# Patient Record
Sex: Male | Born: 1965 | Race: White | Hispanic: No | Marital: Married | Smoking: Never smoker
Health system: Southern US, Community
[De-identification: ages and names within clinical notes are randomized; demographics above are authoritative.]

## PROBLEM LIST (undated history)

## (undated) DIAGNOSIS — E785 Hyperlipidemia, unspecified: Secondary | ICD-10-CM

---

## 2010-09-27 ENCOUNTER — Ambulatory Visit: Payer: Self-pay | Admitting: Internal Medicine

## 2013-10-30 ENCOUNTER — Ambulatory Visit: Payer: Self-pay | Admitting: Physician Assistant

## 2015-11-10 ENCOUNTER — Ambulatory Visit: Payer: 59 | Admitting: Physical Therapy

## 2015-11-11 ENCOUNTER — Ambulatory Visit: Payer: 59 | Attending: Surgical | Admitting: Physical Therapy

## 2015-11-11 ENCOUNTER — Encounter: Payer: Self-pay | Admitting: Physical Therapy

## 2015-11-11 DIAGNOSIS — M25512 Pain in left shoulder: Secondary | ICD-10-CM | POA: Diagnosis present

## 2015-11-11 DIAGNOSIS — M6281 Muscle weakness (generalized): Secondary | ICD-10-CM | POA: Diagnosis present

## 2015-11-11 DIAGNOSIS — M542 Cervicalgia: Secondary | ICD-10-CM | POA: Insufficient documentation

## 2015-11-11 DIAGNOSIS — M546 Pain in thoracic spine: Secondary | ICD-10-CM

## 2015-11-11 NOTE — Therapy (Signed)
[facility]ICINE [address] [place], [place], [address] Phone: [phone]   Fax:  [phone]  Physical Therapy Evaluation  Patient Details  Name: John Medina MRN: [id number] Date of Birth: 1966-04-01 Referring Provider: Jiles Haroldanielle Laliberte, [facility]  Encounter Date: 11/11/2015      PT End of Session - 11/11/15 1128    Visit Number 1   Number of Visits 13   Date for PT Re-Evaluation 12/23/15   PT Start Time 1000   PT Stop Time 1100   PT Time Calculation (min) 60 min   Activity Tolerance Patient tolerated treatment well   Behavior During Therapy [facility] for tasks assessed/performed      No past medical history on file.  No past surgical history on file.  There were no vitals filed for this visit.  Visit Diagnosis:  Thoracic spine pain - Plan: PT plan of care cert/re-cert  Pain in joint of left shoulder - Plan: PT plan of care cert/re-cert  Cervical spine pain - Plan: PT plan of care cert/re-cert  Muscle weakness - Plan: PT plan of care cert/re-cert      Subjective Assessment - 11/11/15 1032    Subjective Pt reports noticing a "knot/and stiffness" in mid back around Sep/oct 2016, pain progressed up back and into L shoulder/neck, progressing to where he was unable to elevate arm until cortisone shot, chiropractor was seen as with Dr. visit w/ cortisone injection which has helped, massage aggravates, home TENS unit seems to help.     Pertinent History Currently pt reports pain in shoulder and thoracic spine, no pain in neck.   Limitations Sitting;Lifting   How long can you sit comfortably? pain with onset of sitting, worse at home office   Patient Stated Goals return to requirement activities for [employer] including overhead lifting, pull ups, decr. pain   Currently in Pain? Yes   Pain Score 2    Pain Location Shoulder   Pain Orientation Left            [facility] PT Assessment - 11/11/15 0001    Assessment   Medical Diagnosis L shoulder RC tendinosis, L thoracic back strain   Referring Provider Jiles Haroldanielle Laliberte, PAC   Onset Date/Surgical Date --  around Oct/Sep 2016   Hand Dominance Right   Prior Therapy Recent visit to chiropractor    Prior Function   Level of Independence Independent   Vocation Full time employment   Vocation Requirements Primarily working on a computer    Leisure running, biking, hiking, spending time with the family   Posture/Postural Control   Posture Comments FHP, rounded shoulders   ROM / Strength   AROM / PROM / Strength AROM;Strength   AROM   Overall AROM Comments Cervical: R lat flexion 30 deg/L 15 deg. Ext limited and painful. Shoulder WFL   Strength   Overall Strength Comments L shoulder; ER and flexion 4/5 with pain, all others 5/5   Palpation   Spinal mobility significant hypomobility noted with CPAs grade I-IV from T1-T6, no lack of mobility above and below these levels. Reproduction of pain at T3.   Palpation comment Trigger points noted at UT, LS, infraspinatus, triceps, biceps.          Objective: CPAs grade III 3x1 min T2-4. Following this pt reported decr. Pain with elevation.  Biceps curl isometric hold 5x30 sec. With 5#. Pt reported no pain but slight "pulling" sensation in biceps tendon region in area of  pain with palpation with performance.  Cervical retractions 3x10, performed with and without "W" with scapular retraction - pt reported this improved his pain considerably.  Bent arm plank with scapular retraction, 5x10 sec. Hold. Focus of this and biceps exercise on non-painful isometric activation of painful regions. Look to progress both exercises at next session. Also noted significant triceps weakness on involved side.                 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   Baseline 5#   Time 6   Period Weeks   Status New   PT LONG TERM GOAL #3   Title Pt will report 0/10 pain in cervicothoracic spine with work activities   Baseline 2-5/10 pain   Time 6   Period Weeks   Status New               Plan - 11/11/15 1129    Clinical Impression Statement Pt is a pleasant 50 y/o male with c/o 6 month h/o thoracic, cervical, scapular and arm pain. 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 Pain gradually worsened in intensity until pt saw chiropractor who addressed cervical spine,  pt also had cortisone shot last week which improved but did not abolish pain in arm. Currently pt presents with shoulder and thoracic spine pain and tightness, pain with palpation of biceps, triceps, and rhomboid, and significant hypomobility in thoracic spine. Overall pain appears to centralize with thoracic treatment so will focus on this, however pt is also severely limited in cervical mobility and this may need to be addressed in the future.   Pt will benefit from skilled therapeutic intervention in order to improve on the following deficits Decreased range of motion;Improper body mechanics;Decreased mobility;Increased muscle spasms;Pain;Decreased strength;Hypomobility   Rehab Potential Good   Clinical Impairments Affecting Rehab Potential pro: activity level, prior exercise background. con: chronic pain, previous instance of neck pain and L sided pain.   PT Frequency 2x / week   PT Duration 6 weeks   PT  Treatment/Interventions Dry needling;Manual techniques;Therapeutic exercise   PT Next Visit Plan progress isometrics for bicep and tricep, continue wiht manual intervention   Consulted and Agree with Plan of Care Patient         Problem List There are no active problems to display for this patient.   Monifah Freehling PT DPT 11/11/2015, 11:46 AM  [facility] PHYSICAL AND SPORTS MEDICINE [address], [place], [address] Phone: [phone]   Fax:  [phone]  Name: John Medina MRN: [id number] Date of Birth: 12-07-1965

## 2015-11-16 ENCOUNTER — Ambulatory Visit: Payer: 59 | Admitting: Physical Therapy

## 2015-11-16 DIAGNOSIS — M25512 Pain in left shoulder: Secondary | ICD-10-CM

## 2015-11-16 DIAGNOSIS — M546 Pain in thoracic spine: Secondary | ICD-10-CM | POA: Diagnosis not present

## 2015-11-16 DIAGNOSIS — M6281 Muscle weakness (generalized): Secondary | ICD-10-CM

## 2015-11-16 NOTE — Therapy (Signed)
[facility] [address] [place], [place], [address] Phone: [phone]   Fax:  [phone]  Physical Therapy Treatment  Patient Details  Name: John PlaneJeffrey Redmon MRN: [id number] Date of Birth: 27-Jan-1966 Referring Provider: Jiles Haroldanielle Laliberte, [facility]  Encounter Date: 11/16/2015      PT End of Session - 11/16/15 1112    Visit Number 2   Number of Visits 13   Date for PT Re-Evaluation 12/23/15   PT Start Time 0947   PT Stop Time 1028   PT Time Calculation (min) 41 min   Activity Tolerance Patient tolerated treatment well   Behavior During Therapy [facility] for tasks assessed/performed      No past medical history on file.  No past surgical history on file.  There were no vitals filed for this visit.  Visit Diagnosis:  Thoracic spine pain  Pain in joint of left shoulder  Muscle weakness      Subjective Assessment - 11/16/15 0949    Subjective Pt reports significant improvement in pain overall in neck. Continues to have L scapular pain, triceps pain is moderate currently.   Pertinent History Currently pt reports pain in shoulder and thoracic spine, no pain in neck.   Limitations Sitting;Lifting   How long can you sit comfortably? pain with onset of sitting, worse at home office   Patient Stated Goals return to requirement activities for [employer] including overhead lifting, pull ups, decr. pain   Currently in Pain? Yes   Pain Score 1                  Objective: CPAs and UPAs grade III T1-T5. UPAs on L. Following this pt demonstrated improvement in ROM pain free.  Trigger point dry needling performed on deltoid, biceps, triceps. STM performed initially in this area to attempt to treat via this, unable to make appropriate change so needled identified manual spots. Most pain with triceps needle which produced high degree of pain. Following this pt reported incr. Pain and soreness in this region.  Used  Cross friction massage for STM and piston and twist technique for needling.  Reviewed HEP. Had pt perform biceps isometric with opposite arm pressure as pt has been avoiding this activity due to not having a weight. Also modified triceps isometric to be performed on the wall.                 PT Education - 11/16/15 1027    Education provided Yes   Education Details educated on trigger point dry needling, pt consented to treatment having been informed of risks.   Person(s) Educated Patient   Methods Explanation   Comprehension Verbalized understanding             PT Long Term Goals - 11/11/15 1139    PT LONG TERM GOAL #1   Title Pt will be able to lift 30# overhead pain free to return to ammo box lift for [employer]   Baseline pain with overhead flexion   Time 6   Period Weeks   Status New   PT LONG TERM GOAL #2   Title Pt will be able to perform 25# bicep curl to 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   Baseline 5#   Time 6   Period Weeks   Status New   PT LONG TERM GOAL #3   Title Pt will report 0/10 pain in cervicothoracic spine with work activities  Baseline 2-5/10 pain   Time 6   Period Weeks   Status New               Plan - 11/16/15 1114    Clinical Impression Statement Pt appears to have made progress with cervical spine primarily. Continues to have moderate pain in arm and scapulothoracic region. definite painful response to trigger point dry needling on triceps indicating potential to need to continue to treat this for several additional sessions. Muscle continues to be weak and would benefit from treatment to address this additionally.   Pt will benefit from skilled therapeutic intervention in order to improve on the following deficits Decreased range of motion;Improper body mechanics;Decreased mobility;Increased muscle spasms;Pain;Decreased strength;Hypomobility   Rehab Potential Good   Clinical Impairments  Affecting Rehab Potential pro: activity level, prior exercise background. con: chronic pain, previous instance of neck pain and L sided pain.   PT Frequency 2x / week   PT Duration 6 weeks   PT Treatment/Interventions Dry needling;Manual techniques;Therapeutic exercise   PT Next Visit Plan progress isometrics for bicep and tricep, continue wiht manual intervention   Consulted and Agree with Plan of Care Patient        Problem List There are no active problems to display for this patient.   Fisher,Benjamin PT DPT 11/16/2015, 11:18 AM  [facility] PHYSICAL AND SPORTS MEDICINE [address] [place], [address] Phone: [phone]   Fax:  [phone]  Name: John Medina MRN: [id number] Date of Birth: 19-Jan-1966

## 2015-11-18 ENCOUNTER — Ambulatory Visit: Payer: 59 | Admitting: Physical Therapy

## 2015-11-18 DIAGNOSIS — M6281 Muscle weakness (generalized): Secondary | ICD-10-CM

## 2015-11-18 DIAGNOSIS — M542 Cervicalgia: Secondary | ICD-10-CM

## 2015-11-18 DIAGNOSIS — M546 Pain in thoracic spine: Secondary | ICD-10-CM | POA: Diagnosis not present

## 2015-11-18 NOTE — Therapy (Signed)
[place] [facility] PH[facility]ORTS MEDICINE [address] [place], [address] Phone: [phone]   Fax:  [phone]  Physical Therapy Treatment  Patient Details  Name: John Medina MRN: [id number] Date of Birth: 07-28-1966 Referring Provider: Jiles Harold, [facility]  Encounter Date: 11/18/2015      PT End of Session - 11/18/15 1133    Visit Number 3   Number of Visits 13   Date for PT Re-Evaluation 12/23/15   PT Start Time 0945   PT Stop Time 1025   PT Time Calculation (min) 40 min   Activity Tolerance Patient tolerated treatment well   Behavior During Therapy [facility] for tasks assessed/performed      No past medical history on file.  No past surgical history on file.  There were no vitals filed for this visit.  Visit Diagnosis:  Muscle weakness  Thoracic spine pain  Cervical spine pain      Subjective Assessment - 11/18/15 1131    Subjective Pt reports he is doing better, triceps is significantly better. continues to have UT and some deltoid pain   Pertinent History Currently pt reports pain in shoulder and thoracic spine, no pain in neck.   Limitations Sitting;Lifting   How long can you sit comfortably? pain with onset of sitting, worse at home office   Patient Stated Goals return to requirement activities for [employer] including overhead lifting, pull ups, decr. pain   Currently in Pain? No/denies   Pain Score 0-No pain               Objective: OMEGA 75# biceps pulldown 3x15. Pt reported pain at 12th rep on final set so encouraged pt to perform at home with only 50# until capable of performing fully 50 reps pain free.  Overhead AROM/reaching performed with cuing for scapular positioning.  Shoulder press OMEGA 10# 3x5. Following this pt reported AC joint pain. Performed 2 min AC joint mobs folllowing this and then one additional set of OMEGA shoulder press with decr. Pain.  Lat stretch with pink  ball leaning forward and pulling flexing at elbows, pt had difficulty tolerating this.  Doorway QL stretch. "That is exactly where I need to stretch". Performed multiple times for 30 sec. Sets.  Trigger point dry needling performed on L UT in two locations with notable significant twitch responses. Following this performed manual neck stretches to address decr. ROM which improved pain considerably, 5 min.                  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   Baseline 5#   Time 6   Period Weeks   Status New   PT LONG TERM GOAL #3   Title Pt will report 0/10 pain in  cervicothoracic spine with work activities   Baseline 2-5/10 pain   Time 6   Period Weeks   Status New               Plan - 11/18/15 1134    Clinical Impression Statement Mild biceps pain with multiple reps of biceps lat pulldown indicating continued mild myofascial pain in this region.UT with notable trigger points. Pt is I withHEP once he is educated on performance so issued new HEP to address biceps and triceps strengthening, as well as modification of push up position.   Pt will benefit from skilled therapeutic intervention in order to improve on the following deficits Decreased range of motion;Improper body mechanics;Decreased mobility;Increased muscle spasms;Pain;Decreased strength;Hypomobility   Rehab Potential Good   Clinical Impairments Affecting Rehab  Potential pro: activity level, prior exercise background. con: chronic pain, previous instance of neck pain and L sided pain.   PT Frequency 2x / week   PT Duration 6 weeks   PT Treatment/Interventions Dry needling;Manual techniques;Therapeutic exercise   PT Next Visit Plan progress isometrics for bicep and tricep, continue wiht manual intervention   Consulted and Agree with Plan of Care Patient        Problem List There are no active problems to display for this patient.   Kenyetta Wimbish PT DPT 11/18/2015, 11:36 AM  [facility] [facility] PH[facility]TS MEDICINE [address] [place], [place], [address] Phone: [phone]   Fax:  [phone]  Name: John Medina MRN: [id number] Date of Birth: 07-17-1966

## 2015-11-23 ENCOUNTER — Ambulatory Visit: Payer: 59 | Admitting: Physical Therapy

## 2015-11-25 ENCOUNTER — Ambulatory Visit: Payer: 59 | Admitting: Physical Therapy

## 2015-11-26 ENCOUNTER — Ambulatory Visit: Payer: 59 | Admitting: Physical Therapy

## 2015-11-26 DIAGNOSIS — M25512 Pain in left shoulder: Secondary | ICD-10-CM

## 2015-11-26 DIAGNOSIS — M546 Pain in thoracic spine: Secondary | ICD-10-CM | POA: Diagnosis not present

## 2015-11-26 DIAGNOSIS — M6281 Muscle weakness (generalized): Secondary | ICD-10-CM

## 2015-11-26 NOTE — Therapy (Signed)
[place] [facility]IC[facility] [address] [place], [place], [address] Phone: [phone]   Fax:  [phone]  Physical Therapy Treatment  Patient Details  Name: John PlaneJeffrey Medina MRN: [id number] Date of Birth: Jun 20, 1966 Referring Provider: Jiles Haroldanielle Laliberte, [facility]  Encounter Date: 11/26/2015      PT End of Session - 11/26/15 1231    Visit Number 4   Number of Visits 13   Date for PT Re-Evaluation 12/23/15   PT Start Time 1156   PT Stop Time 1234   PT Time Calculation (min) 38 min   Activity Tolerance Patient tolerated treatment well   Behavior During Therapy [facility] for tasks assessed/performed      No past medical history on file.  No past surgical history on file.  There were no vitals filed for this visit.  Visit Diagnosis:  Muscle weakness  Pain in joint of left shoulder      Subjective Assessment - 11/26/15 1158    Subjective Pt reports he is doing very poorly. He is working from home currently due to a very stressful week and an inability to return for PT this week.   Pertinent History Currently pt reports pain in shoulder and thoracic spine, no pain in neck.   Limitations Sitting;Lifting   How long can you sit comfortably? pain with onset of sitting, worse at home office   Patient Stated Goals return to requirement activities for [employer] including overhead lifting, pull ups, decr. pain   Currently in Pain? Yes   Pain Score 2    Pain Location Arm   Pain Orientation Left             Objective: Based on pt report of pain focused on treating pain rather than strengthening this session.  Trigger point dry needling performed, 8 needles used over deltoid insertion, mid deltoid, down triceps. Piston technique. Notable and painful LTR in deltoid which lasted for several rounds of pistoning.  STM also performed in same region.  Following this pt reported overall pain of 1/10.  Tested resisted ER  which is now sharply painful.  Extensively educated pt on avoiding painful motions related to abduction and ER.  Isometric ER against wall, 5% engagement which pt reported produced "pull" but no pain.                    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   Baseline 5#   Time 6   Period Weeks   Status New   PT LONG TERM GOAL #3   Title Pt will report 0/10 pain in cervicothoracic spine with work activities   Baseline 2-5/10 pain   Time 6   Period Weeks   Status New  Plan - 11/26/15 1232    Clinical Impression Statement At end of session pt reported minimal pain in deltoid and triceps region compared with moderate at start of session. Focal pain in supraspinatus region so focused on isometric exercises and manual therapy to address supraspinatus tendinopathy.    Pt will benefit from skilled therapeutic intervention in order to improve on the following deficits Decreased range of motion;Improper body mechanics;Decreased mobility;Increased muscle spasms;Pain;Decreased strength;Hypomobility   Rehab Potential Good   Clinical Impairments Affecting Rehab Potential pro: activity level, prior exercise background. con: chronic pain, previous instance of neck pain and L sided pain.   PT Frequency 2x / week   PT Duration 6 weeks   PT Treatment/Interventions Dry needling;Manual techniques;Therapeutic exercise    PT Next Visit Plan progress isometrics for bicep and tricep, continue wiht manual intervention   Consulted and Agree with Plan of Care Patient        Problem List There are no active problems to display for this patient.   Darshana Curnutt PT DPT 11/26/2015, 12:35 PM  [place] [facility] PHYSICAL AND SPORTS MEDICINE [address] [place], [address] Phone: [phone]   Fax:  [phone]  Name: John Medina MRN: [id number] Date of Birth: 30-Aug-1966

## 2015-11-30 ENCOUNTER — Ambulatory Visit: Payer: 59

## 2015-11-30 ENCOUNTER — Ambulatory Visit: Payer: 59 | Admitting: Physical Therapy

## 2015-11-30 DIAGNOSIS — M6281 Muscle weakness (generalized): Secondary | ICD-10-CM

## 2015-11-30 DIAGNOSIS — M25512 Pain in left shoulder: Secondary | ICD-10-CM

## 2015-11-30 DIAGNOSIS — M546 Pain in thoracic spine: Secondary | ICD-10-CM | POA: Diagnosis not present

## 2015-12-01 NOTE — Therapy (Signed)
[place] [facility] [address], [place], [address] Phone: [phone]   Fax:  [phone]  Physical Therapy Treatment  Patient Details  Name: John Medina MRN: [id number] Date of Birth: [birth year] Referring Provider: Jiles Harold, [facility]  Encounter Date: 11/30/2015      PT End of Session - 12/01/15 6962    Visit Number 5   Number of Visits 13   Date for PT Re-Evaluation 12/23/15   PT Start Time 1530   PT Stop Time 1615   PT Time Calculation (min) 45 min   Activity Tolerance Patient tolerated treatment well   Behavior During Therapy [facility] for tasks assessed/performed      History reviewed. No pertinent past medical history.  History reviewed. No pertinent past surgical history.  There were no vitals filed for this visit.  Visit Diagnosis:  Muscle weakness  Pain in joint of left shoulder      Subjective Assessment - 11/30/15 1638    Subjective Pt reports that he has experienced very slow improvement since starting therapy. He denies pain at rest currently but still reports increase in pain with L shoulder AROM abduction from shoulder height and above. No specific questions or concerns at this time.    Pertinent History Currently pt reports pain in shoulder and thoracic spine, no pain in neck.   Limitations Sitting;Lifting   How long can you sit comfortably? pain with onset of sitting, worse at home office   Patient Stated Goals return to requirement activities for [employer] including overhead lifting, pull ups, decr. pain   Currently in Pain? No/denies  At rest      Objective:  Manual Therapy PROM L shoulder for flexion and scaption; L shoulder AP (supine) and PA (prone) mobilizations 30 sec/bout x 3 bouts each, grade III-IV; T2-T7 CPA grade III-IV 1 bout/level, 30 seconds/bout; T2-T7 L UPA grade III-IV 1 bout/level, 30 seconds/bout; Pt with no change in symptoms following cervical  manual distraction; Soft tissue assessment revealing trigger points in rhomboids as well as upper trap.  Reports some mild increase in L upper trap pain with Spurlings but not numbness/tingling into LUE; Mild decrease in L shoulder pain with scapular assist;  There-ex Pt educated about foam rolling for thoracic spine. 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   Baseline 5#   Time 6   Period Weeks   Status New   PT LONG TERM GOAL #3   Title Pt will report 0/10 pain in cervicothoracic spine with work activities   Baseline 2-5/10 pain   Time 6   Period Weeks   Status New               Plan - 12/01/15 16100822    Clinical Impression Statement Pt demonstrates consirable L mid and low trap weakness compared to R side during MMT. Pt also with poor endurance, fatiguing quickly during prone exercises. 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 Pt encouraged to continue HEP and  follow-up as scheduled.    Pt will benefit from skilled therapeutic intervention in order to improve on the following deficits Decreased range of motion;Improper body mechanics;Decreased mobility;Increased muscle spasms;Pain;Decreased strength;Hypomobility   Rehab Potential Good   Clinical Impairments Affecting Rehab Potential pro: activity level, prior exercise background. con: chronic pain, previous instance of neck pain and L sided pain.   PT Frequency 2x / week   PT Duration 6 weeks   PT Treatment/Interventions Dry needling;Manual techniques;Therapeutic exercise   PT Next Visit Plan progress isometrics for bicep and tricep, continue wiht manual intervention   PT Home Exercise Plan As prescribed   Consulted and Agree with Plan of Care Patient        Problem List There are no active problems to display for this patient.  Lynnea MaizesJason D Lillieann Pavlich PT, DPT   Wynton Hufstetler 12/01/2015, 8:24 AM  [place] [facility] [address] [place], [place], [address] Phone: [phone]   Fax:  [phone]  Name: John Medina MRN: [id number] Date of Birth: [birth year]

## 2015-12-02 ENCOUNTER — Ambulatory Visit: Payer: 59 | Admitting: Physical Therapy

## 2015-12-03 ENCOUNTER — Encounter: Payer: Self-pay | Admitting: Physical Therapy

## 2015-12-03 ENCOUNTER — Ambulatory Visit: Payer: 59

## 2015-12-03 DIAGNOSIS — M546 Pain in thoracic spine: Secondary | ICD-10-CM

## 2015-12-03 DIAGNOSIS — M6281 Muscle weakness (generalized): Secondary | ICD-10-CM

## 2015-12-03 DIAGNOSIS — M25512 Pain in left shoulder: Secondary | ICD-10-CM

## 2015-12-03 NOTE — Therapy (Signed)
[place] [facility] [address], [place], [address] Phone: [phone]   Fax:  [phone]  Physical Therapy Treatment  Patient Details  Name: John Medina MRN: [id number] Date of Birth: 23-Jan-1966 Referring Provider: Jiles Harold, [facility]  Encounter Date: 12/03/2015      PT End of Session - 12/03/15 0858    Visit Number 6   Number of Visits 13   Date for PT Re-Evaluation 12/23/15   PT Start Time 0805   PT Stop Time 0845   PT Time Calculation (min) 40 min   Activity Tolerance Patient tolerated treatment well   Behavior During Therapy [facility] for tasks assessed/performed      History reviewed. No pertinent past medical history.  History reviewed. No pertinent past surgical history.  There were no vitals filed for this visit.  Visit Diagnosis:  Muscle weakness  Pain in joint of left shoulder  Thoracic spine pain      Subjective Assessment - 12/03/15 0806    Subjective Pt states he is doing well on this date. He felt well the afternoon following therapy and the next day. No pain at rest today but reports pain with overhead motions of L shoulder. No specific questions or concerns currently.    Pertinent History Currently pt reports pain in shoulder and thoracic spine, no pain in neck.   Limitations Sitting;Lifting   How long can you sit comfortably? pain with onset of sitting, worse at home office   Patient Stated Goals return to requirement activities for [employer] including overhead lifting, pull ups, decr. pain   Currently in Pain? No/denies       Objective:  Manual Therapy PROM L shoulder for flexion and scaption; L shoulder AP (supine) mobilizations 30 sec/bout x 3 bouts each, grade III-IV; Seated L shoulder abduction with upward rotation assist and mobilization at end range 2 x 10 bouts; Gentle L shoulder distraction oscillation x 1 minute; STM to L proximal biceps including long  head bicep tendon;  There-ex Standing L pec doorway stretch 30 seconds x 3; Standing L lat doorway stretch 30 seconds x 3; Pt educated about foam rolling for thoracic spine. Performed with patient moving from segment to segment first with rolling and followed by thoracic extension over foam roller; Educated about foam rolling for L lats; Prone L shoulder extension 2# x 10; Prone L shoulder mid trap rows 2#  x 10; Prone lower trap "Ys" no weight x 10; MMT reveals considerable weakness of L mid and low traps (3+/5) compared to R side (4 to 4+);                           PT Education - 12/03/15 0855    Education provided Yes   Education Details HEP reinforced, discussed external impingement, educated about postural education as well as pec and lat stretches   Person(s) Educated Patient   Methods Explanation   Comprehension Verbalized understanding             PT Long Term Goals - 11/11/15 1139    PT LONG TERM GOAL #1   Title Pt will be able to lift 30# overhead pain free 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   Baseline 5#   Time 6   Period Weeks   Status New   PT LONG TERM GOAL #3   Title Pt will report 0/10 pain in cervicothoracic spine with work activities   Baseline 2-5/10 pain   Time 6   Period Weeks   Status New               Plan - 12/03/15 0906    Clinical Impression Statement Pt reports significant pain with light palpation over L long head bicep tendon. Significant forward scapular protraction noted in sitting and supine with poor sitting posture. Discussed the importance of seated posture with scapular and cervical retraction to neutral to miimiize L shoulder pain/impingement. Pt educated about pec and lat  stretches as well as foam rolling for thoracic mobility. Pt educated about how to roll lats. May benefit from education about how to roll pec muscles as well. Pt encouraged to continue HEP with particular focus on posture. Follow-up as scheduled.    Pt will benefit from skilled therapeutic intervention in order to improve on the following deficits Decreased range of motion;Improper body mechanics;Decreased mobility;Increased muscle spasms;Pain;Decreased strength;Hypomobility   Rehab Potential Good   Clinical Impairments Affecting Rehab Potential pro: activity level, prior exercise background. con: chronic pain, previous instance of neck pain and L sided pain.   PT Frequency 2x / week   PT Duration 6 weeks   PT Treatment/Interventions Dry needling;Manual techniques;Therapeutic exercise   PT Next Visit Plan progress isometrics for bicep and tricep, continue with manual intervention, foam rolling education for pec and lats   PT Home Exercise Plan As prescribed   Consulted and Agree with Plan of Care Patient        Problem List There are no active problems to display for this patient.  Lynnea MaizesJason D Anamari Galeas PT, DPT   Cassadee Vanzandt 12/03/2015, 9:11 AM  [facility] [address] [place], [place], [address] Phone: [phone]   Fax:  [phone]  Name: John Medina MRN: [id number] Date of Birth: May 30, 1966

## 2015-12-07 ENCOUNTER — Ambulatory Visit: Payer: 59 | Attending: Surgical | Admitting: Physical Therapy

## 2015-12-07 DIAGNOSIS — M6281 Muscle weakness (generalized): Secondary | ICD-10-CM | POA: Insufficient documentation

## 2015-12-07 DIAGNOSIS — M25512 Pain in left shoulder: Secondary | ICD-10-CM | POA: Diagnosis present

## 2015-12-07 NOTE — Therapy (Signed)
[place] [facility] [address], [place], [address] Phone: [phone]   Fax:  [phone]  Physical Therapy Treatment  Patient Details  Name: John Medina MRN: [id number] Date of Birth: 02-Jan-1966 Referring Provider: Jiles Harold, [facility]  Encounter Date: 12/07/2015      PT End of Session - 12/07/15 1043    Visit Number 7   Number of Visits 13   Date for PT Re-Evaluation 12/23/15   PT Start Time 0900   PT Stop Time 0940   PT Time Calculation (min) 40 min   Activity Tolerance Patient tolerated treatment well   Behavior During Therapy [facility] for tasks assessed/performed      No past medical history on file.  No past surgical history on file.  There were no vitals filed for this visit.  Visit Diagnosis:  Muscle weakness      Subjective Assessment - 12/07/15 1005    Subjective Pt reports he is doing well. He was ill over the weekend so has been inconsistent with his HEP. He is feelign better today but acknowledges that he is not using his UE.   Pertinent History Currently pt reports pain in shoulder and thoracic spine, no pain in neck.   Limitations Sitting;Lifting   How long can you sit comfortably? pain with onset of sitting, worse at home office   Patient Stated Goals return to requirement activities for [employer] including overhead lifting, pull ups, decr. pain   Currently in Pain? No/denies   Pain Score 0-No pain            Objective: UE ranger for decr. Tightness with AROM 3x10 end range shoulder flexion stretch, arm at side ER stretch 3x10.  RTB low row for scap activation, pt able to perform but with thready activation.  RTB fast oscillations 3x30 sec. Unilaterally performed to improve activaiton, cuing to IR shoulder to improve activation.  OMEGA shoulder press 1x15 with 25#, incr. Pain 1x8 with 15# able to perform 6 prior to pain, primarily in deltoid. With cuing to  retract cervical spine improved pain but continued so deferred additional shoulder press  Forward flies, no weight 3x10. 2# wt 3x5.  Lateral flies no weight 1x5, pt reported incr. Tightness and some deltoid pain with this. Added 2# wt which improved pain, pt able to perform x3 with no pain.  Educated pt on performing 2-3 reps every 2-3 hrs to incr. Tolerance for exercise.  Bent row 3x5 with 2# wt.  Issued this as HEP for pt with cuing to avoid pain but to improve posture.                     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   Baseline 5#   Time 6   Period Weeks   Status New  PT LONG TERM GOAL #3   Title Pt will report 0/10 pain in cervicothoracic spine with work activities   Baseline 2-5/10 pain   Time 6   Period Weeks   Status New               Plan - 12/07/15 1046    Clinical Impression Statement As pt has been feeling decr. pain, focused on low level exercise while challenging posture. Pt continues to be somewhat limited in tolerance for activity but this is fairly typical for someone who has been avoding use of UE for an extended period of time. Focus of next few sessions on progressing tolerance for activity while minimizing pain.   Pt will benefit from skilled therapeutic intervention in order to improve on the following deficits Decreased range of motion;Improper body mechanics;Decreased mobility;Increased muscle spasms;Pain;Decreased strength;Hypomobility   Rehab Potential Good   Clinical  Impairments Affecting Rehab Potential pro: activity level, prior exercise background. con: chronic pain, previous instance of neck pain and L sided pain.   PT Frequency 2x / week   PT Duration 6 weeks   PT Treatment/Interventions Dry needling;Manual techniques;Therapeutic exercise   PT Next Visit Plan progress isometrics for bicep and tricep, continue with manual intervention, foam rolling education for pec and lats   PT Home Exercise Plan As prescribed   Consulted and Agree with Plan of Care Patient        Problem List There are no active problems to display for this patient.   Lizanne Erker PT DPT 12/07/2015, 10:52 AM  [place] [facility] [address] [place], [place], [address] Phone: [phone]   Fax:  [phone]  Name: John Medina MRN: [id number] Date of Birth: 26-Mar-1966

## 2015-12-10 ENCOUNTER — Ambulatory Visit: Payer: 59 | Admitting: Physical Therapy

## 2015-12-10 DIAGNOSIS — M6281 Muscle weakness (generalized): Secondary | ICD-10-CM | POA: Diagnosis not present

## 2015-12-10 DIAGNOSIS — M25512 Pain in left shoulder: Secondary | ICD-10-CM

## 2015-12-10 NOTE — Therapy (Signed)
[place] [facility] [address], [place], [address] Phone: [phone]   Fax:  [phone]  Physical Therapy Treatment  Patient Details  Name: Laurent Cargile MRN: [id number] Date of Birth: [birth year] Referring Provider: Jiles Harold, [facility]  Encounter Date: 12/10/2015      PT End of Session - 12/10/15 1511    Visit Number 8   Number of Visits 13   Date for PT Re-Evaluation 12/23/15   PT Start Time 1400   PT Stop Time 1430   PT Time Calculation (min) 30 min   Activity Tolerance Patient tolerated treatment well   Behavior During Therapy [facility] for tasks assessed/performed      No past medical history on file.  No past surgical history on file.  There were no vitals filed for this visit.  Visit Diagnosis:  Pain in joint of left shoulder  Muscle weakness      Subjective Assessment - 12/10/15 1403    Subjective Pt reports he is doing well. He was ill over the weekend so has been inconsistent with his HEP. He is feelign better today but acknowledges that he is not using his UE.   Pertinent History Currently pt reports pain in shoulder and thoracic spine, no pain in neck.   Limitations Sitting;Lifting   How long can you sit comfortably? pain with onset of sitting, worse at home office   Patient Stated Goals return to requirement activities for [employer] including overhead lifting, pull ups, decr. pain   Currently in Pain? Yes   Pain Score 3    Pain Location Arm   Pain Orientation Left              Objective: Supine arm crank mob x5 min total, extensive cuing for relaxation. Initially pt c/o pain with this in posterior cuff, performed 1 min MFR which resolved pain.  STM performed on deltoid, triceps, lat. Followign this pt reported his pain was improved but not resolved.   Trigger point dry needling performed on the same. Lat significantly painful with highly irritable trigger  point.  Following session pt reported complete reslution of pain.                        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   Baseline 5#   Time 6   Period Weeks   Status New   PT LONG TERM GOAL #3   Title Pt will report 0/10 pain in cervicothoracic spine with work activities   Baseline 2-5/10 pain   Time 6   Period Weeks   Status New               Plan - 12/10/15 1512    Clinical Impression Statement Pain has been incr. over the past few days so focus of session on pain relief. Pt responded well to manual therapy. PT encouraged pt to attempt pull up at next session as he needs to be able to do this due  to [employer] requirements.   Pt will benefit from skilled therapeutic intervention in order to improve on the following deficits Decreased range of motion;Improper body mechanics;Decreased mobility;Increased muscle spasms;Pain;Decreased strength;Hypomobility   Rehab Potential Good   Clinical Impairments Affecting Rehab Potential pro: activity level, prior exercise background. con: chronic pain, previous instance of neck pain and L sided pain.   PT Frequency 2x / week   PT Duration 6 weeks   PT Treatment/Interventions Dry needling;Manual techniques;Therapeutic exercise   PT Next Visit Plan progress isometrics for bicep and tricep, continue with manual intervention, foam rolling education for pec and lats   PT Home Exercise Plan As prescribed   Consulted and Agree with Plan of Care Patient        Problem List There are no active problems to display for this patient.   Fisher,Benjamin PT DPT 12/10/2015, 3:21  PM  [place] [facility] [address] [place], [place], [address] Phone: [phone]   Fax:  [phone]  Name: Sheliah PlaneJeffrey Manthey MRN: [id number] Date of Birth: 03-07-1966

## 2015-12-14 ENCOUNTER — Ambulatory Visit: Payer: 59 | Admitting: Physical Therapy

## 2015-12-14 DIAGNOSIS — M6281 Muscle weakness (generalized): Secondary | ICD-10-CM | POA: Diagnosis not present

## 2015-12-14 DIAGNOSIS — M25512 Pain in left shoulder: Secondary | ICD-10-CM

## 2015-12-14 NOTE — Therapy (Signed)
[place] [facility] [address] [place], [address] Phone: [phone]   Fax:  [phone]  Physical Therapy Treatment  Patient Details  Name: Drevin Ortner MRN: [id number] Date of Birth: 1966/04/01 Referring Provider: Jiles Harold, [facility]  Encounter Date: 12/14/2015      PT End of Session - 12/14/15 1437    Visit Number 9   Number of Visits 13   Date for PT Re-Evaluation 12/23/15   PT Start Time 1357   PT Stop Time 1437   PT Time Calculation (min) 40 min   Activity Tolerance Patient tolerated treatment well   Behavior During Therapy [facility] for tasks assessed/performed      No past medical history on file.  No past surgical history on file.  There were no vitals filed for this visit.      Subjective Assessment - 12/14/15 1355    Subjective Pt felt well over the weekend, he was able to use a posthole digger with no difficulty. primary pain is in L UT and pt has had inconsistent HEP   Pertinent History Currently pt reports pain in shoulder and thoracic spine, no pain in neck.   Limitations Sitting;Lifting   How long can you sit comfortably? pain with onset of sitting, worse at home office   Patient Stated Goals return to requirement activities for [employer] including overhead lifting, pull ups, decr. pain   Currently in Pain? Yes   Pain Score 2    Pain Orientation Left            Objective: STM performed on L UT, LS, scalene.  Trigger point dry needling performed on UT and LS, noted highly antalgic dry needling on L UT with immediate improvement in pain to 0/10.  Following this performed seated LS stretch seated with hand under seat and head turned to the opposite side. x5 min total with cuing for performance at home.  Farmer's carry 40# 5x10 meters, cuing for activation of LS muscle.  Overhead 8# press with rotation, hip flexion (spiderman press). Following this pt reported  significant improvement in tightness to "no tightness".                          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   Baseline 5#   Time 6   Period Weeks   Status New   PT LONG TERM GOAL #3   Title Pt will report 0/10 pain in cervicothoracic spine with work activities   Baseline 2-5/10 pain   Time 6   Period Weeks   Status New               Plan - 12/14/15 1438    Clinical Impression Statement Improvement within session with UT and LS palpation/pain with dry needling, manual intervention and exercise. Continues to be stiff in this region and would benefit from focused low trap activation to facilitate upper trap deactivation.   Rehab Potential Good   Clinical  Impairments Affecting Rehab Potential pro: activity level, prior exercise background. con: chronic pain, previous instance of neck pain and L sided pain.   PT Frequency 2x / week   PT Duration 6 weeks   PT Treatment/Interventions Dry needling;Manual techniques;Therapeutic exercise   PT Next Visit Plan progress isometrics for bicep and tricep, continue with manual intervention, foam rolling education for pec and lats   PT Home Exercise Plan As prescribed   Consulted and Agree with Plan of Care Patient      Patient will benefit from skilled therapeutic intervention in order to improve the following deficits and impairments:  Decreased range of motion, Improper body mechanics, Decreased mobility, Increased muscle spasms, Pain, Decreased strength, Hypomobility  Visit Diagnosis: Pain in joint of left shoulder  Muscle weakness     Problem List There are no  active problems to display for this patient.   Fisher,Benjamin PT DPT 12/14/2015, 2:42 PM  [place] [facility] [address] [place], [place], [address] Phone: [phone]   Fax:  [phone]  Name: Sheliah PlaneJeffrey Gillen MRN: [id number] Date of Birth: 1966/05/04

## 2015-12-17 ENCOUNTER — Ambulatory Visit: Payer: 59 | Admitting: Physical Therapy

## 2015-12-21 ENCOUNTER — Ambulatory Visit: Payer: 59 | Admitting: Physical Therapy

## 2015-12-24 ENCOUNTER — Encounter: Payer: 59 | Admitting: Physical Therapy

## 2015-12-28 ENCOUNTER — Encounter: Payer: 59 | Admitting: Physical Therapy

## 2015-12-30 ENCOUNTER — Encounter: Payer: 59 | Admitting: Physical Therapy

## 2016-01-04 ENCOUNTER — Encounter: Payer: 59 | Admitting: Physical Therapy

## 2016-01-07 ENCOUNTER — Encounter: Payer: 59 | Admitting: Physical Therapy

## 2016-01-11 ENCOUNTER — Encounter: Payer: 59 | Admitting: Physical Therapy

## 2016-01-14 ENCOUNTER — Encounter: Payer: 59 | Admitting: Physical Therapy

## 2016-10-12 DIAGNOSIS — M791 Myalgia: Secondary | ICD-10-CM | POA: Diagnosis not present

## 2016-10-12 DIAGNOSIS — M9901 Segmental and somatic dysfunction of cervical region: Secondary | ICD-10-CM | POA: Diagnosis not present

## 2016-10-12 DIAGNOSIS — M542 Cervicalgia: Secondary | ICD-10-CM | POA: Diagnosis not present

## 2016-11-17 DIAGNOSIS — M9901 Segmental and somatic dysfunction of cervical region: Secondary | ICD-10-CM | POA: Diagnosis not present

## 2016-11-17 DIAGNOSIS — M542 Cervicalgia: Secondary | ICD-10-CM | POA: Diagnosis not present

## 2016-11-17 DIAGNOSIS — M791 Myalgia: Secondary | ICD-10-CM | POA: Diagnosis not present

## 2016-12-30 DIAGNOSIS — M542 Cervicalgia: Secondary | ICD-10-CM | POA: Diagnosis not present

## 2016-12-30 DIAGNOSIS — M9901 Segmental and somatic dysfunction of cervical region: Secondary | ICD-10-CM | POA: Diagnosis not present

## 2016-12-30 DIAGNOSIS — M791 Myalgia: Secondary | ICD-10-CM | POA: Diagnosis not present

## 2017-01-20 DIAGNOSIS — M9901 Segmental and somatic dysfunction of cervical region: Secondary | ICD-10-CM | POA: Diagnosis not present

## 2017-01-20 DIAGNOSIS — M542 Cervicalgia: Secondary | ICD-10-CM | POA: Diagnosis not present

## 2017-01-20 DIAGNOSIS — M9903 Segmental and somatic dysfunction of lumbar region: Secondary | ICD-10-CM | POA: Diagnosis not present

## 2017-02-15 DIAGNOSIS — M5412 Radiculopathy, cervical region: Secondary | ICD-10-CM | POA: Diagnosis not present

## 2017-02-15 DIAGNOSIS — M25512 Pain in left shoulder: Secondary | ICD-10-CM | POA: Diagnosis not present

## 2017-03-01 DIAGNOSIS — M542 Cervicalgia: Secondary | ICD-10-CM | POA: Diagnosis not present

## 2017-03-01 DIAGNOSIS — M25512 Pain in left shoulder: Secondary | ICD-10-CM | POA: Diagnosis not present

## 2017-03-07 ENCOUNTER — Other Ambulatory Visit: Payer: Self-pay | Admitting: Orthopedic Surgery

## 2017-03-07 DIAGNOSIS — M542 Cervicalgia: Secondary | ICD-10-CM

## 2017-03-07 DIAGNOSIS — M25512 Pain in left shoulder: Secondary | ICD-10-CM

## 2017-03-14 ENCOUNTER — Ambulatory Visit: Payer: 59

## 2017-03-15 ENCOUNTER — Ambulatory Visit: Admission: RE | Admit: 2017-03-15 | Payer: 59 | Source: Ambulatory Visit

## 2017-03-15 ENCOUNTER — Ambulatory Visit: Payer: 59

## 2017-03-24 ENCOUNTER — Ambulatory Visit
Admission: RE | Admit: 2017-03-24 | Discharge: 2017-03-24 | Disposition: A | Discharge status: Home / Self Care | Payer: 59 | Source: Ambulatory Visit | Attending: Orthopedic Surgery | Admitting: Orthopedic Surgery

## 2017-03-24 DIAGNOSIS — M4802 Spinal stenosis, cervical region: Secondary | ICD-10-CM | POA: Insufficient documentation

## 2017-03-24 DIAGNOSIS — R609 Edema, unspecified: Secondary | ICD-10-CM | POA: Insufficient documentation

## 2017-03-24 DIAGNOSIS — M25512 Pain in left shoulder: Secondary | ICD-10-CM | POA: Diagnosis not present

## 2017-03-24 DIAGNOSIS — M542 Cervicalgia: Secondary | ICD-10-CM | POA: Diagnosis not present

## 2017-03-28 ENCOUNTER — Ambulatory Visit
Admission: RE | Admit: 2017-03-28 | Discharge: 2017-03-28 | Disposition: A | Discharge status: Home / Self Care | Payer: 59 | Source: Ambulatory Visit | Attending: Orthopedic Surgery | Admitting: Orthopedic Surgery

## 2017-03-28 DIAGNOSIS — X58XXXA Exposure to other specified factors, initial encounter: Secondary | ICD-10-CM | POA: Insufficient documentation

## 2017-03-28 DIAGNOSIS — M25512 Pain in left shoulder: Secondary | ICD-10-CM

## 2017-03-28 DIAGNOSIS — S43432A Superior glenoid labrum lesion of left shoulder, initial encounter: Secondary | ICD-10-CM | POA: Diagnosis not present

## 2017-03-28 MED ORDER — GADOBENATE DIMEGLUMINE 529 MG/ML IV SOLN
0.1000 mL | Freq: Once | INTRAVENOUS | Status: AC | PRN
  Administered 2017-03-28: 0.1 mL via INTRA_ARTICULAR

## 2017-03-28 MED ORDER — IOPAMIDOL (ISOVUE-200) INJECTION 41%
10.0000 mL | Freq: Once | INTRAVENOUS | Status: AC | PRN
  Administered 2017-03-28: 10 mL
  Filled 2017-03-28: qty 50

## 2017-03-28 MED ORDER — LIDOCAINE HCL (PF) 1 % IJ SOLN
30.0000 mL | Freq: Once | INTRAMUSCULAR | Status: AC
  Administered 2017-03-28: 10 mL via INTRADERMAL
  Filled 2017-03-28: qty 30

## 2017-03-30 DIAGNOSIS — M50222 Other cervical disc displacement at C5-C6 level: Secondary | ICD-10-CM | POA: Diagnosis not present

## 2017-03-30 DIAGNOSIS — M5412 Radiculopathy, cervical region: Secondary | ICD-10-CM | POA: Diagnosis not present

## 2017-03-30 DIAGNOSIS — M50223 Other cervical disc displacement at C6-C7 level: Secondary | ICD-10-CM | POA: Diagnosis not present

## 2017-04-06 DIAGNOSIS — M5412 Radiculopathy, cervical region: Secondary | ICD-10-CM | POA: Diagnosis not present

## 2017-04-06 DIAGNOSIS — M50223 Other cervical disc displacement at C6-C7 level: Secondary | ICD-10-CM | POA: Diagnosis not present

## 2017-04-06 DIAGNOSIS — M50222 Other cervical disc displacement at C5-C6 level: Secondary | ICD-10-CM | POA: Diagnosis not present

## 2017-04-24 DIAGNOSIS — M5412 Radiculopathy, cervical region: Secondary | ICD-10-CM | POA: Diagnosis not present

## 2017-06-01 DIAGNOSIS — M791 Myalgia: Secondary | ICD-10-CM | POA: Diagnosis not present

## 2017-06-01 DIAGNOSIS — M9901 Segmental and somatic dysfunction of cervical region: Secondary | ICD-10-CM | POA: Diagnosis not present

## 2017-06-01 DIAGNOSIS — M542 Cervicalgia: Secondary | ICD-10-CM | POA: Diagnosis not present

## 2017-06-06 DIAGNOSIS — M4802 Spinal stenosis, cervical region: Secondary | ICD-10-CM | POA: Diagnosis not present

## 2017-06-06 DIAGNOSIS — M5030 Other cervical disc degeneration, unspecified cervical region: Secondary | ICD-10-CM | POA: Diagnosis not present

## 2017-06-06 DIAGNOSIS — M5412 Radiculopathy, cervical region: Secondary | ICD-10-CM | POA: Diagnosis not present

## 2017-06-19 DIAGNOSIS — M5030 Other cervical disc degeneration, unspecified cervical region: Secondary | ICD-10-CM | POA: Diagnosis not present

## 2017-06-20 DIAGNOSIS — M7542 Impingement syndrome of left shoulder: Secondary | ICD-10-CM | POA: Diagnosis not present

## 2017-06-21 DIAGNOSIS — I10 Essential (primary) hypertension: Secondary | ICD-10-CM | POA: Diagnosis not present

## 2017-06-21 DIAGNOSIS — Z125 Encounter for screening for malignant neoplasm of prostate: Secondary | ICD-10-CM | POA: Diagnosis not present

## 2017-06-21 DIAGNOSIS — Z0000 Encounter for general adult medical examination without abnormal findings: Secondary | ICD-10-CM | POA: Diagnosis not present

## 2017-06-23 ENCOUNTER — Other Ambulatory Visit: Payer: Self-pay | Admitting: Orthopedic Surgery

## 2017-06-26 DIAGNOSIS — Z791 Long term (current) use of non-steroidal anti-inflammatories (NSAID): Secondary | ICD-10-CM | POA: Diagnosis not present

## 2017-06-26 DIAGNOSIS — I10 Essential (primary) hypertension: Secondary | ICD-10-CM | POA: Diagnosis not present

## 2017-06-26 DIAGNOSIS — M5030 Other cervical disc degeneration, unspecified cervical region: Secondary | ICD-10-CM | POA: Diagnosis not present

## 2017-06-30 DIAGNOSIS — M5030 Other cervical disc degeneration, unspecified cervical region: Secondary | ICD-10-CM | POA: Diagnosis not present

## 2017-07-03 DIAGNOSIS — M5030 Other cervical disc degeneration, unspecified cervical region: Secondary | ICD-10-CM | POA: Diagnosis not present

## 2017-07-04 ENCOUNTER — Inpatient Hospital Stay
Admission: RE | Admit: 2017-07-04 | Discharge: 2017-07-04 | Disposition: A | Discharge status: Home / Self Care | Payer: 59 | Source: Ambulatory Visit

## 2017-07-05 DIAGNOSIS — E785 Hyperlipidemia, unspecified: Secondary | ICD-10-CM | POA: Diagnosis not present

## 2017-07-05 DIAGNOSIS — I10 Essential (primary) hypertension: Secondary | ICD-10-CM | POA: Diagnosis not present

## 2017-07-06 DIAGNOSIS — M542 Cervicalgia: Secondary | ICD-10-CM | POA: Diagnosis not present

## 2017-07-06 DIAGNOSIS — S43432D Superior glenoid labrum lesion of left shoulder, subsequent encounter: Secondary | ICD-10-CM | POA: Diagnosis not present

## 2017-07-06 DIAGNOSIS — M9901 Segmental and somatic dysfunction of cervical region: Secondary | ICD-10-CM | POA: Diagnosis not present

## 2017-07-06 DIAGNOSIS — M7542 Impingement syndrome of left shoulder: Secondary | ICD-10-CM | POA: Diagnosis not present

## 2017-07-06 DIAGNOSIS — M7918 Myalgia, other site: Secondary | ICD-10-CM | POA: Diagnosis not present

## 2017-07-07 ENCOUNTER — Inpatient Hospital Stay: Admission: RE | Admit: 2017-07-07 | Payer: 59 | Source: Ambulatory Visit

## 2017-07-07 DIAGNOSIS — M5030 Other cervical disc degeneration, unspecified cervical region: Secondary | ICD-10-CM | POA: Diagnosis not present

## 2017-07-10 DIAGNOSIS — M5030 Other cervical disc degeneration, unspecified cervical region: Secondary | ICD-10-CM | POA: Diagnosis not present

## 2017-07-11 ENCOUNTER — Encounter: Admission: RE | Payer: Self-pay | Source: Ambulatory Visit

## 2017-07-11 ENCOUNTER — Ambulatory Visit: Admission: RE | Admit: 2017-07-11 | Payer: 59 | Source: Ambulatory Visit | Admitting: Orthopedic Surgery

## 2017-07-11 SURGERY — Surgical Case
Anesthesia: *Unknown

## 2017-07-11 SURGERY — ARTHROSCOPY, SHOULDER WITH REPAIR, ROTATOR CUFF, OPEN
Anesthesia: Choice | Laterality: Left

## 2017-07-14 DIAGNOSIS — M5030 Other cervical disc degeneration, unspecified cervical region: Secondary | ICD-10-CM | POA: Diagnosis not present

## 2017-07-17 DIAGNOSIS — M5030 Other cervical disc degeneration, unspecified cervical region: Secondary | ICD-10-CM | POA: Diagnosis not present

## 2017-07-21 DIAGNOSIS — M5030 Other cervical disc degeneration, unspecified cervical region: Secondary | ICD-10-CM | POA: Diagnosis not present

## 2017-07-26 DIAGNOSIS — M5030 Other cervical disc degeneration, unspecified cervical region: Secondary | ICD-10-CM | POA: Diagnosis not present

## 2017-07-31 DIAGNOSIS — M5030 Other cervical disc degeneration, unspecified cervical region: Secondary | ICD-10-CM | POA: Diagnosis not present

## 2017-08-04 DIAGNOSIS — M5030 Other cervical disc degeneration, unspecified cervical region: Secondary | ICD-10-CM | POA: Diagnosis not present

## 2017-08-10 DIAGNOSIS — M9901 Segmental and somatic dysfunction of cervical region: Secondary | ICD-10-CM | POA: Diagnosis not present

## 2017-08-10 DIAGNOSIS — M50222 Other cervical disc displacement at C5-C6 level: Secondary | ICD-10-CM | POA: Diagnosis not present

## 2017-08-11 DIAGNOSIS — M5030 Other cervical disc degeneration, unspecified cervical region: Secondary | ICD-10-CM | POA: Diagnosis not present

## 2017-08-16 DIAGNOSIS — M5030 Other cervical disc degeneration, unspecified cervical region: Secondary | ICD-10-CM | POA: Diagnosis not present

## 2017-08-16 DIAGNOSIS — M5412 Radiculopathy, cervical region: Secondary | ICD-10-CM | POA: Diagnosis not present

## 2017-08-16 DIAGNOSIS — M4802 Spinal stenosis, cervical region: Secondary | ICD-10-CM | POA: Diagnosis not present

## 2017-08-17 DIAGNOSIS — M7542 Impingement syndrome of left shoulder: Secondary | ICD-10-CM | POA: Diagnosis not present

## 2017-08-18 DIAGNOSIS — M5030 Other cervical disc degeneration, unspecified cervical region: Secondary | ICD-10-CM | POA: Diagnosis not present

## 2017-08-21 DIAGNOSIS — M5030 Other cervical disc degeneration, unspecified cervical region: Secondary | ICD-10-CM | POA: Diagnosis not present

## 2017-08-30 DIAGNOSIS — M5030 Other cervical disc degeneration, unspecified cervical region: Secondary | ICD-10-CM | POA: Diagnosis not present

## 2017-09-20 DIAGNOSIS — Z1211 Encounter for screening for malignant neoplasm of colon: Secondary | ICD-10-CM | POA: Diagnosis not present

## 2017-09-28 DIAGNOSIS — E785 Hyperlipidemia, unspecified: Secondary | ICD-10-CM | POA: Diagnosis not present

## 2017-09-28 DIAGNOSIS — I10 Essential (primary) hypertension: Secondary | ICD-10-CM | POA: Diagnosis not present

## 2017-10-05 DIAGNOSIS — Z125 Encounter for screening for malignant neoplasm of prostate: Secondary | ICD-10-CM | POA: Diagnosis not present

## 2017-10-05 DIAGNOSIS — E785 Hyperlipidemia, unspecified: Secondary | ICD-10-CM | POA: Diagnosis not present

## 2017-10-05 DIAGNOSIS — I10 Essential (primary) hypertension: Secondary | ICD-10-CM | POA: Diagnosis not present

## 2017-10-13 DIAGNOSIS — M5030 Other cervical disc degeneration, unspecified cervical region: Secondary | ICD-10-CM | POA: Diagnosis not present

## 2017-10-13 DIAGNOSIS — M5412 Radiculopathy, cervical region: Secondary | ICD-10-CM | POA: Diagnosis not present

## 2017-10-26 DIAGNOSIS — M50222 Other cervical disc displacement at C5-C6 level: Secondary | ICD-10-CM | POA: Diagnosis not present

## 2017-10-26 DIAGNOSIS — M9901 Segmental and somatic dysfunction of cervical region: Secondary | ICD-10-CM | POA: Diagnosis not present

## 2017-11-17 DIAGNOSIS — M50222 Other cervical disc displacement at C5-C6 level: Secondary | ICD-10-CM | POA: Diagnosis not present

## 2017-11-17 DIAGNOSIS — M9901 Segmental and somatic dysfunction of cervical region: Secondary | ICD-10-CM | POA: Diagnosis not present

## 2017-11-17 DIAGNOSIS — Z820 Family history of epilepsy and other diseases of the nervous system: Secondary | ICD-10-CM | POA: Diagnosis not present

## 2017-11-24 DIAGNOSIS — M5030 Other cervical disc degeneration, unspecified cervical region: Secondary | ICD-10-CM | POA: Diagnosis not present

## 2017-11-24 DIAGNOSIS — M5412 Radiculopathy, cervical region: Secondary | ICD-10-CM | POA: Diagnosis not present

## 2017-12-01 DIAGNOSIS — Z1211 Encounter for screening for malignant neoplasm of colon: Secondary | ICD-10-CM | POA: Diagnosis not present

## 2017-12-11 DIAGNOSIS — M50222 Other cervical disc displacement at C5-C6 level: Secondary | ICD-10-CM | POA: Diagnosis not present

## 2017-12-11 DIAGNOSIS — M9901 Segmental and somatic dysfunction of cervical region: Secondary | ICD-10-CM | POA: Diagnosis not present

## 2017-12-27 DIAGNOSIS — M4802 Spinal stenosis, cervical region: Secondary | ICD-10-CM | POA: Diagnosis not present

## 2017-12-27 DIAGNOSIS — M5030 Other cervical disc degeneration, unspecified cervical region: Secondary | ICD-10-CM | POA: Diagnosis not present

## 2017-12-27 DIAGNOSIS — M5412 Radiculopathy, cervical region: Secondary | ICD-10-CM | POA: Diagnosis not present

## 2018-02-14 ENCOUNTER — Other Ambulatory Visit: Payer: Self-pay | Admitting: Physical Medicine and Rehabilitation

## 2018-02-14 DIAGNOSIS — M5412 Radiculopathy, cervical region: Secondary | ICD-10-CM | POA: Diagnosis not present

## 2018-02-14 DIAGNOSIS — M5030 Other cervical disc degeneration, unspecified cervical region: Secondary | ICD-10-CM | POA: Diagnosis not present

## 2018-02-22 DIAGNOSIS — M50222 Other cervical disc displacement at C5-C6 level: Secondary | ICD-10-CM | POA: Diagnosis not present

## 2018-02-22 DIAGNOSIS — M9901 Segmental and somatic dysfunction of cervical region: Secondary | ICD-10-CM | POA: Diagnosis not present

## 2018-02-24 ENCOUNTER — Ambulatory Visit
Admission: RE | Admit: 2018-02-24 | Discharge: 2018-02-24 | Disposition: A | Discharge status: Home / Self Care | Payer: 59 | Source: Ambulatory Visit | Attending: Physical Medicine and Rehabilitation | Admitting: Physical Medicine and Rehabilitation

## 2018-03-16 DIAGNOSIS — S86892A Other injury of other muscle(s) and tendon(s) at lower leg level, left leg, initial encounter: Secondary | ICD-10-CM | POA: Diagnosis not present

## 2018-03-16 DIAGNOSIS — M25511 Pain in right shoulder: Secondary | ICD-10-CM | POA: Diagnosis not present

## 2018-04-13 DIAGNOSIS — M50222 Other cervical disc displacement at C5-C6 level: Secondary | ICD-10-CM | POA: Diagnosis not present

## 2018-04-13 DIAGNOSIS — M9901 Segmental and somatic dysfunction of cervical region: Secondary | ICD-10-CM | POA: Diagnosis not present

## 2018-04-18 DIAGNOSIS — M4802 Spinal stenosis, cervical region: Secondary | ICD-10-CM | POA: Diagnosis not present

## 2018-04-18 DIAGNOSIS — M5412 Radiculopathy, cervical region: Secondary | ICD-10-CM | POA: Diagnosis not present

## 2018-04-18 DIAGNOSIS — M5030 Other cervical disc degeneration, unspecified cervical region: Secondary | ICD-10-CM | POA: Diagnosis not present

## 2018-05-03 DIAGNOSIS — M25672 Stiffness of left ankle, not elsewhere classified: Secondary | ICD-10-CM | POA: Diagnosis not present

## 2018-05-03 DIAGNOSIS — S86892A Other injury of other muscle(s) and tendon(s) at lower leg level, left leg, initial encounter: Secondary | ICD-10-CM | POA: Diagnosis not present

## 2018-05-03 DIAGNOSIS — M25572 Pain in left ankle and joints of left foot: Secondary | ICD-10-CM | POA: Diagnosis not present

## 2018-06-27 DIAGNOSIS — M9903 Segmental and somatic dysfunction of lumbar region: Secondary | ICD-10-CM | POA: Diagnosis not present

## 2018-06-27 DIAGNOSIS — M5127 Other intervertebral disc displacement, lumbosacral region: Secondary | ICD-10-CM | POA: Diagnosis not present

## 2018-06-27 DIAGNOSIS — M62830 Muscle spasm of back: Secondary | ICD-10-CM | POA: Diagnosis not present

## 2018-07-18 DIAGNOSIS — M5030 Other cervical disc degeneration, unspecified cervical region: Secondary | ICD-10-CM | POA: Diagnosis not present

## 2018-07-18 DIAGNOSIS — M5412 Radiculopathy, cervical region: Secondary | ICD-10-CM | POA: Diagnosis not present

## 2018-07-18 DIAGNOSIS — M4802 Spinal stenosis, cervical region: Secondary | ICD-10-CM | POA: Diagnosis not present

## 2018-07-27 DIAGNOSIS — M62830 Muscle spasm of back: Secondary | ICD-10-CM | POA: Diagnosis not present

## 2018-07-27 DIAGNOSIS — M5127 Other intervertebral disc displacement, lumbosacral region: Secondary | ICD-10-CM | POA: Diagnosis not present

## 2018-07-27 DIAGNOSIS — M9903 Segmental and somatic dysfunction of lumbar region: Secondary | ICD-10-CM | POA: Diagnosis not present

## 2018-08-22 IMAGING — MR MR CERVICAL SPINE W/O CM
5 series · 33 of 48 positions shown · non-contrast
Comparison: None.

CLINICAL DATA: Left-sided neck and shoulder pain. Numbness and
tingling in the forearm.

EXAM:
MRI CERVICAL SPINE WITHOUT CONTRAST
TECHNIQUE: Multiplanar, multisequence MR imaging of the cervical spine was
performed. No intravenous contrast was administered.

[Series 2: T2 · sagittal · 3.0mm · 0.70mm/px · 6 of 13 slices shown (1 of 2)]
[im 1/13]
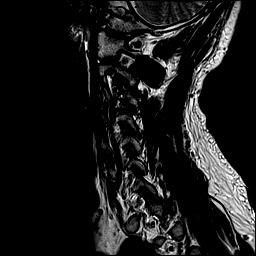
[im 3/13]
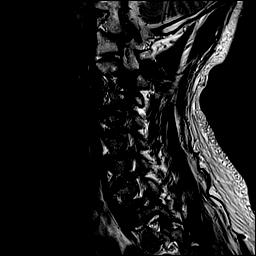
[im 5/13]
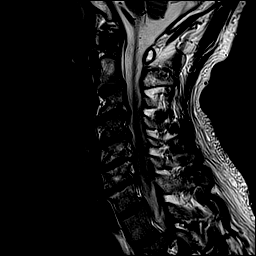
[im 8/13]
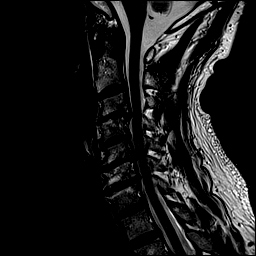
[im 10/13]
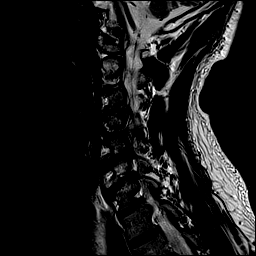
[im 13/13]
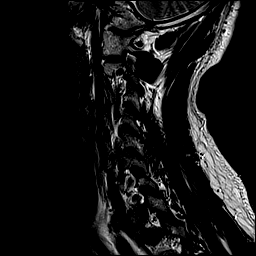

[Series 3: T1 · sagittal · 3.0mm · 0.70mm/px · 6 of 13 slices shown]
[im 1/13]
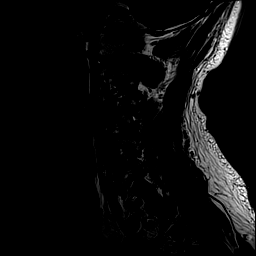
[im 3/13]
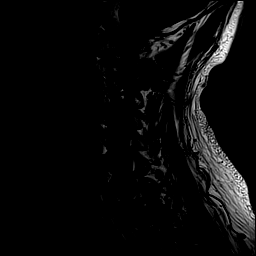
[im 5/13]
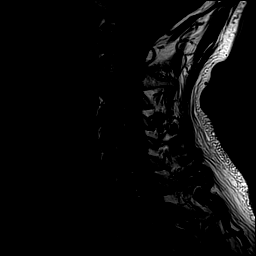
[im 8/13]
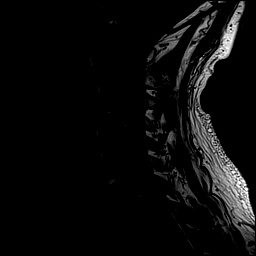
[im 10/13]
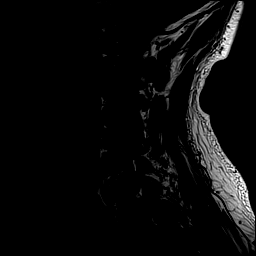
[im 13/13]
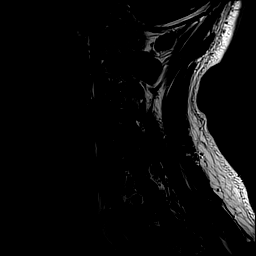

[Series 4: STIR · sagittal · 3.0mm · 0.35mm/px · 6 of 13 slices shown]
[im 1/13]
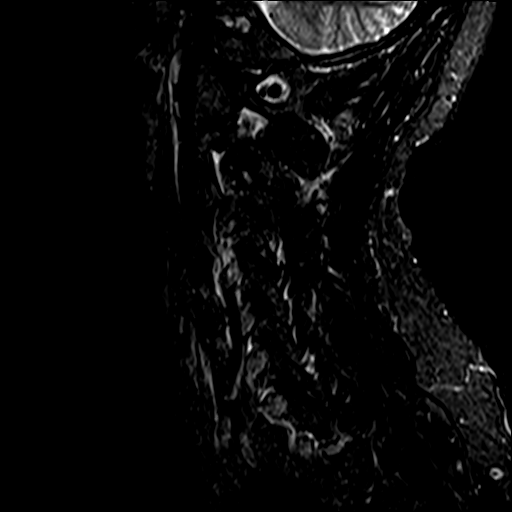
[im 3/13]
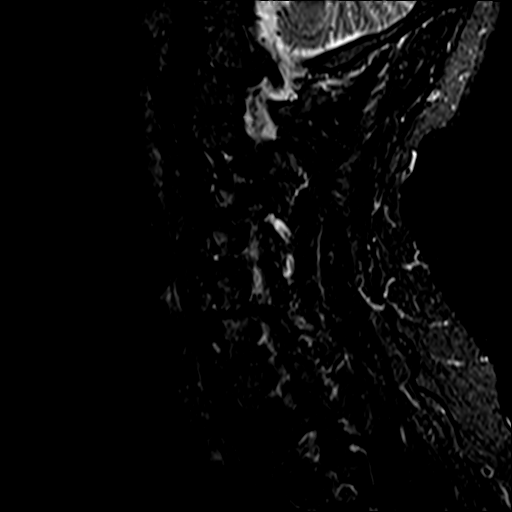
[im 5/13]
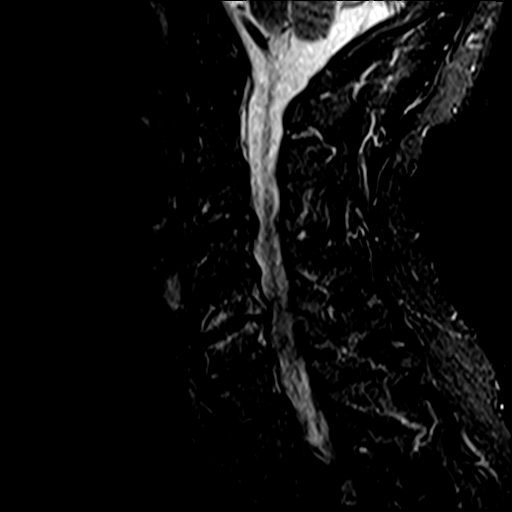
[im 8/13]
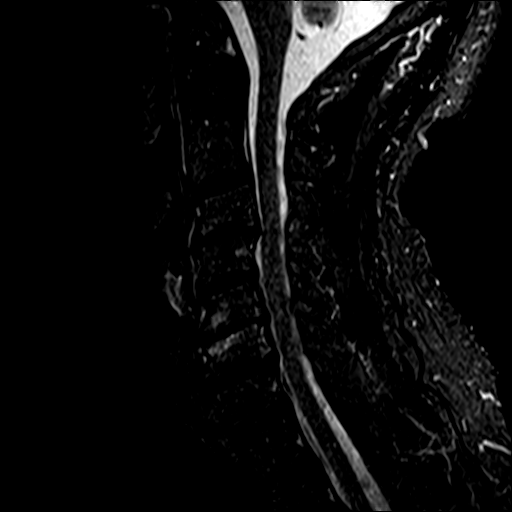
[im 10/13]
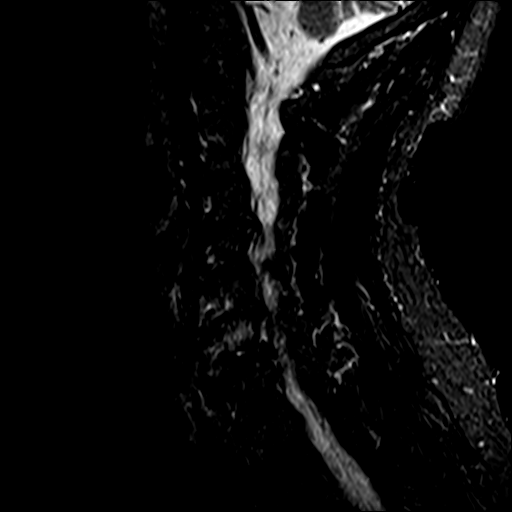
[im 13/13]
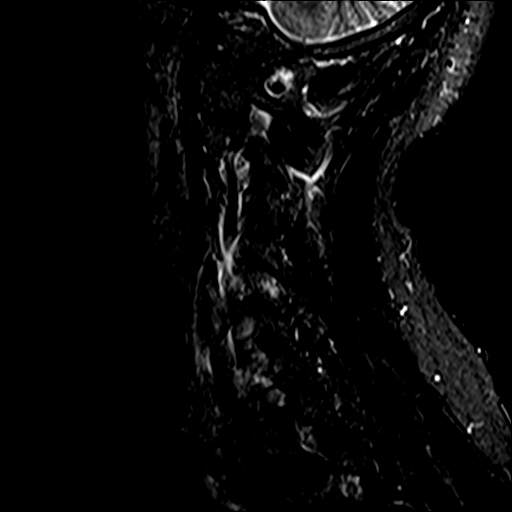

[Series 5: T2 · axial · 3.0mm · 0.70mm/px · z∈[-63,+46]mm · 9 of 30 slices shown (2 of 2)]
[im 1/30]
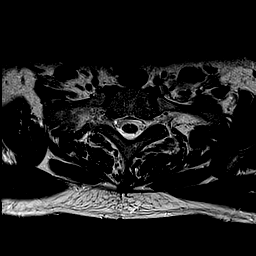
[im 5/30]
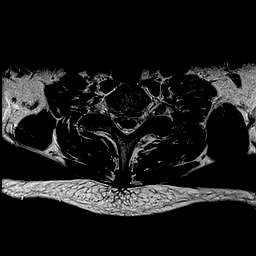
[im 9/30]
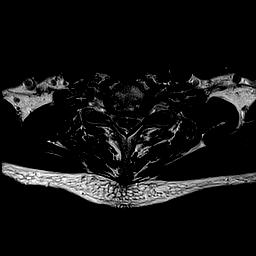
[im 13/30]
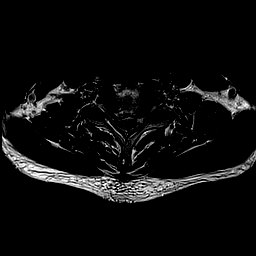
[im 15/30]
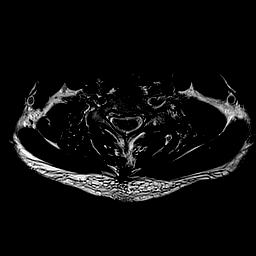
[im 17/30]
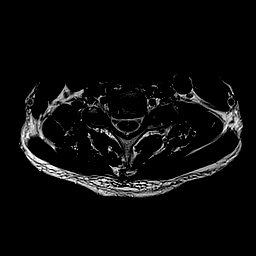
[im 21/30]
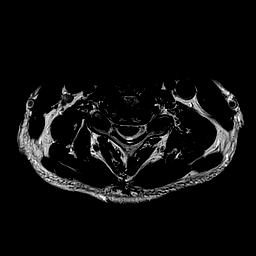
[im 25/30]
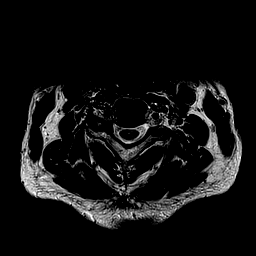
[im 30/30]
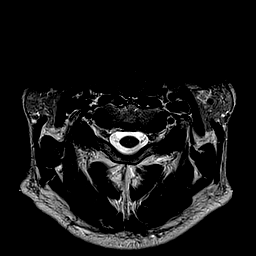

[Series 6: mpgr ax · axial · 3.0mm · 0.35mm/px · z∈[-63,+12]mm · 6 of 30 slices shown]
[im 1/30]
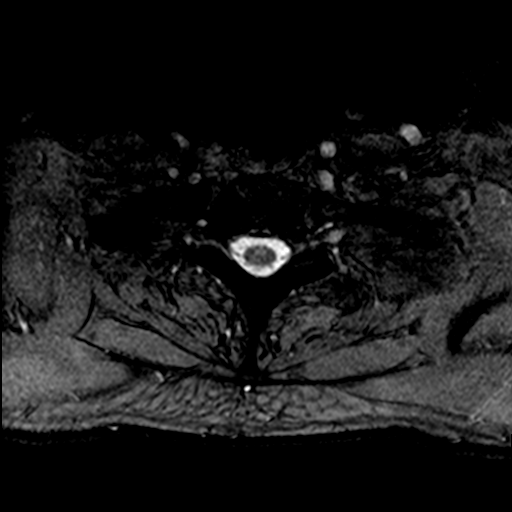
[im 5/30]
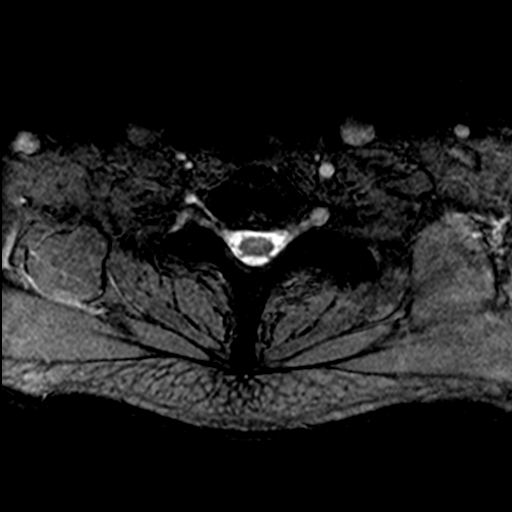
[im 9/30]
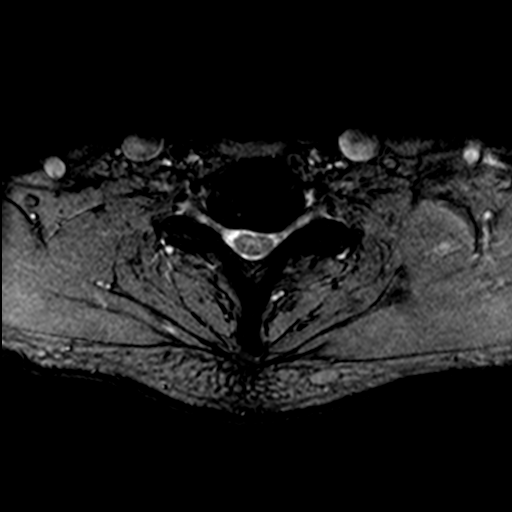
[im 13/30]
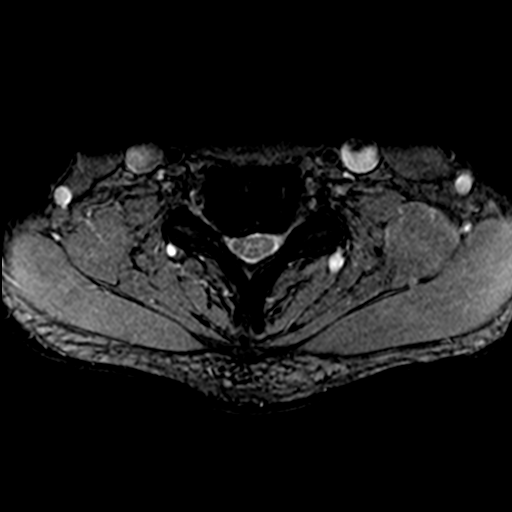
[im 17/30]
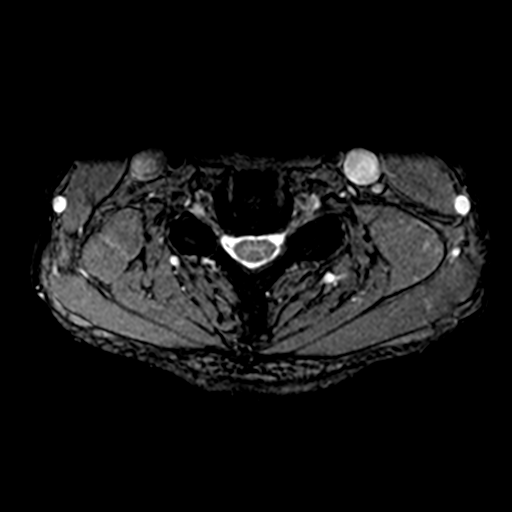
[im 21/30]
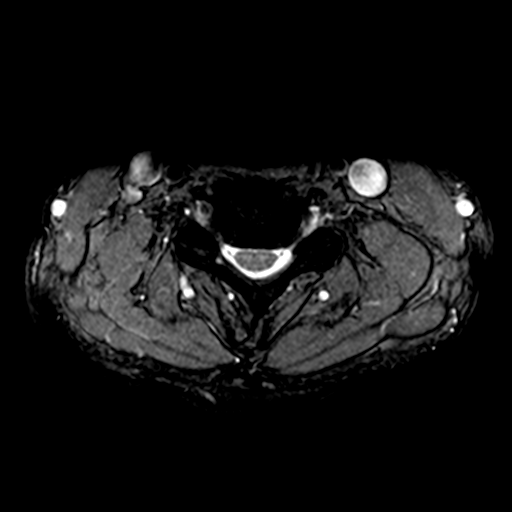

[33 of 48 positions shown; findings below may reference images not displayed]

FINDINGS: Alignment: Grade 1 retrolisthesis at C3-C4, C4-C5 and C5-C6.

Vertebrae: No fracture, evidence of discitis, or bone lesion. Left
C5-C6 facet edema.

Cord: Normal signal and morphology.

Posterior Fossa, vertebral arteries, paraspinal tissues: Negative.

Disc levels:

C1-C2: Normal.

C2-C3: Mild right uncovertebral hypertrophy with mild right
foraminal narrowing. No spinal canal or neural foraminal stenosis.

C3-C4: Bilateral uncovertebral hypertrophy and small disc bulge. No
central spinal canal stenosis. Mild bilateral neural foraminal
stenosis.

C4-C5: Small disc bulge without spinal canal stenosis. No neural
foraminal stenosis.

C5-C6: Disc space narrowing with bilateral uncovertebral hypertrophy
with right greater than left narrowing of the subarticular recess.
Severe right and moderate left neural foraminal stenosis.

C6-C7: Left-sided uncovertebral hypertrophy with moderate left
neural foraminal stenosis. No central spinal canal stenosis or right
neural foraminal stenosis.

C7-T1: Normal disc space and facets. No spinal canal or
neuroforaminal stenosis.
IMPRESSION: 1. Multilevel degenerative disc disease with severe neural foraminal
stenosis bilaterally at C5-C6 and moderate left neural foraminal
stenosis at C6-C7.
2. No spinal canal stenosis.
3. Left C5-C6 facet edema may be a source of local neck pain.

## 2018-08-24 DIAGNOSIS — Z0000 Encounter for general adult medical examination without abnormal findings: Secondary | ICD-10-CM | POA: Diagnosis not present

## 2018-08-24 DIAGNOSIS — E785 Hyperlipidemia, unspecified: Secondary | ICD-10-CM | POA: Diagnosis not present

## 2018-08-24 DIAGNOSIS — I10 Essential (primary) hypertension: Secondary | ICD-10-CM | POA: Diagnosis not present

## 2018-08-26 IMAGING — RF DG FLUORO GUIDE NDL PLC/BX
1 series · 1 of 1 positions shown · non-contrast
Comparison: none

CLINICAL DATA: Left shoulder pain.

[Series 1: fluoro_iodine 2fps_bw · 0.18mm/px · 1 of 1 slices shown]
[im 1/1]
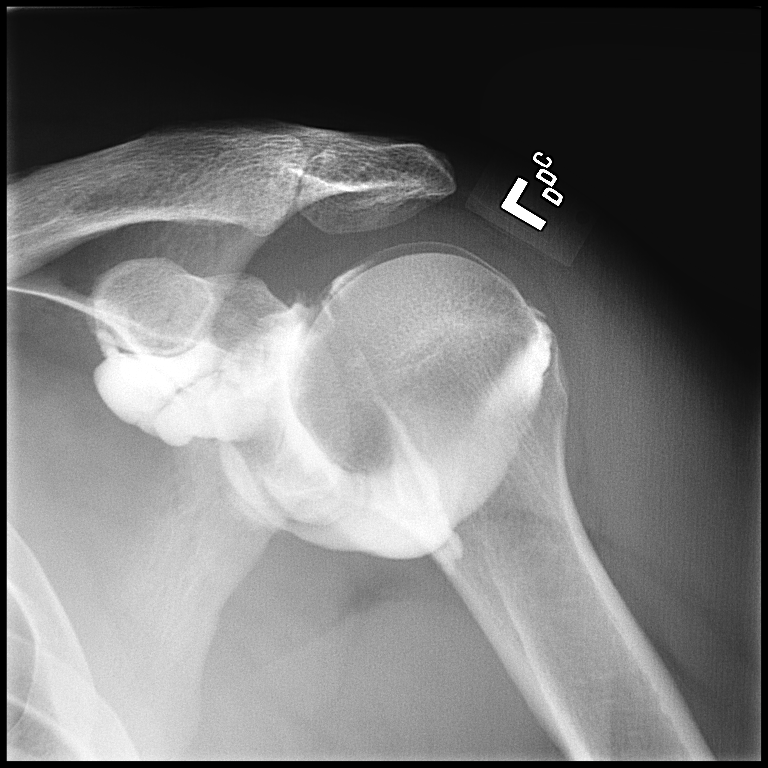

[1 of 1 positions shown; findings below may reference images not displayed]

EXAM:
LEFT SHOULDER INJECTION UNDER FLUOROSCOPY FOR MRI ARTHROGRAPHY

FLUOROSCOPY TIME:  Fluoroscopy Time:  1 minutes 42 seconds

Radiation Exposure Index (if provided by the fluoroscopic device):
14 mGy

Number of Acquired Spot Images: 1

PROCEDURE:
After discussing the risks and benefits of this procedure the
patient informed consent was obtained. Left shoulder was sterilely
prepped and draped. Following local anesthesia 1% lidocaine 22 gauge
needle was advanced into the left shoulder joint under fluoroscopic
guidance. Standardized mixture of Isovue, 1% lidocaine, and and
Magnevist administered for subsequent MRI arthrography. No
complications. Hemostasis achieved following needle removal.
IMPRESSION: Successful left shoulder injection for MRI arthrography.

## 2018-08-30 DIAGNOSIS — E785 Hyperlipidemia, unspecified: Secondary | ICD-10-CM | POA: Diagnosis not present

## 2018-08-30 DIAGNOSIS — Z0000 Encounter for general adult medical examination without abnormal findings: Secondary | ICD-10-CM | POA: Diagnosis not present

## 2018-08-30 DIAGNOSIS — I10 Essential (primary) hypertension: Secondary | ICD-10-CM | POA: Diagnosis not present

## 2018-09-28 DIAGNOSIS — M9901 Segmental and somatic dysfunction of cervical region: Secondary | ICD-10-CM | POA: Diagnosis not present

## 2018-09-28 DIAGNOSIS — M62830 Muscle spasm of back: Secondary | ICD-10-CM | POA: Diagnosis not present

## 2018-09-28 DIAGNOSIS — M542 Cervicalgia: Secondary | ICD-10-CM | POA: Diagnosis not present

## 2018-11-22 DIAGNOSIS — M5412 Radiculopathy, cervical region: Secondary | ICD-10-CM | POA: Diagnosis not present

## 2018-11-22 DIAGNOSIS — M5030 Other cervical disc degeneration, unspecified cervical region: Secondary | ICD-10-CM | POA: Diagnosis not present

## 2018-11-22 DIAGNOSIS — M4802 Spinal stenosis, cervical region: Secondary | ICD-10-CM | POA: Diagnosis not present

## 2019-01-24 DIAGNOSIS — M62830 Muscle spasm of back: Secondary | ICD-10-CM | POA: Diagnosis not present

## 2019-01-24 DIAGNOSIS — M542 Cervicalgia: Secondary | ICD-10-CM | POA: Diagnosis not present

## 2019-01-24 DIAGNOSIS — M9901 Segmental and somatic dysfunction of cervical region: Secondary | ICD-10-CM | POA: Diagnosis not present

## 2019-05-22 ENCOUNTER — Other Ambulatory Visit: Payer: Self-pay | Admitting: Sports Medicine

## 2019-05-22 DIAGNOSIS — M79605 Pain in left leg: Secondary | ICD-10-CM

## 2019-06-02 ENCOUNTER — Other Ambulatory Visit: Payer: Self-pay

## 2019-06-02 ENCOUNTER — Ambulatory Visit
Admission: RE | Admit: 2019-06-02 | Discharge: 2019-06-02 | Disposition: A | Discharge status: Home / Self Care | Payer: 59 | Source: Ambulatory Visit | Attending: Sports Medicine | Admitting: Sports Medicine

## 2019-06-02 DIAGNOSIS — M79605 Pain in left leg: Secondary | ICD-10-CM | POA: Insufficient documentation

## 2019-11-05 ENCOUNTER — Other Ambulatory Visit: Payer: Self-pay | Admitting: Sports Medicine

## 2019-11-05 DIAGNOSIS — M5441 Lumbago with sciatica, right side: Secondary | ICD-10-CM

## 2019-11-05 DIAGNOSIS — M4726 Other spondylosis with radiculopathy, lumbar region: Secondary | ICD-10-CM

## 2019-11-05 DIAGNOSIS — M5137 Other intervertebral disc degeneration, lumbosacral region: Secondary | ICD-10-CM

## 2019-11-05 DIAGNOSIS — M5442 Lumbago with sciatica, left side: Secondary | ICD-10-CM

## 2019-11-16 ENCOUNTER — Ambulatory Visit
Admission: RE | Admit: 2019-11-16 | Discharge: 2019-11-16 | Disposition: A | Discharge status: Home / Self Care | Payer: 59 | Source: Ambulatory Visit | Attending: Sports Medicine | Admitting: Sports Medicine

## 2019-11-16 ENCOUNTER — Other Ambulatory Visit: Payer: Self-pay

## 2019-11-16 DIAGNOSIS — M5441 Lumbago with sciatica, right side: Secondary | ICD-10-CM

## 2019-11-16 DIAGNOSIS — M5137 Other intervertebral disc degeneration, lumbosacral region: Secondary | ICD-10-CM

## 2019-11-16 DIAGNOSIS — M5442 Lumbago with sciatica, left side: Secondary | ICD-10-CM | POA: Diagnosis present

## 2019-11-16 DIAGNOSIS — M4726 Other spondylosis with radiculopathy, lumbar region: Secondary | ICD-10-CM | POA: Diagnosis present

## 2020-03-13 ENCOUNTER — Emergency Department: Payer: 59

## 2020-03-13 ENCOUNTER — Other Ambulatory Visit: Payer: Self-pay

## 2020-03-13 DIAGNOSIS — R0789 Other chest pain: Secondary | ICD-10-CM | POA: Diagnosis present

## 2020-03-13 DIAGNOSIS — Z5321 Procedure and treatment not carried out due to patient leaving prior to being seen by health care provider: Secondary | ICD-10-CM | POA: Insufficient documentation

## 2020-03-13 DIAGNOSIS — M7989 Other specified soft tissue disorders: Secondary | ICD-10-CM | POA: Diagnosis not present

## 2020-03-13 LAB — CBC
HCT: 43.1 % (ref 39.0–52.0)
Hemoglobin: 15.2 g/dL (ref 13.0–17.0)
MCH: 30.6 pg (ref 26.0–34.0)
MCHC: 35.3 g/dL (ref 30.0–36.0)
MCV: 86.7 fL (ref 80.0–100.0)
Platelets: 272 10*3/uL (ref 150–400)
RBC: 4.97 MIL/uL (ref 4.22–5.81)
RDW: 11.7 % (ref 11.5–15.5)
WBC: 7.4 10*3/uL (ref 4.0–10.5)
nRBC: 0 % (ref 0.0–0.2)

## 2020-03-13 LAB — BASIC METABOLIC PANEL
Anion gap: 10 (ref 5–15)
BUN: 16 mg/dL (ref 6–20)
CO2: 27 mmol/L (ref 22–32)
Calcium: 9.8 mg/dL (ref 8.9–10.3)
Chloride: 100 mmol/L (ref 98–111)
Creatinine, Ser: 1.2 mg/dL (ref 0.61–1.24)
GFR calc Af Amer: >60 mL/min (ref >60)
GFR calc non Af Amer: >60 mL/min (ref >60)
Glucose, Bld: 96 mg/dL (ref 70–99)
Potassium: 3.3 mmol/L — ABNORMAL LOW (ref 3.5–5.1)
Sodium: 137 mmol/L (ref 135–145)

## 2020-03-13 LAB — TROPONIN I (HIGH SENSITIVITY): Troponin I (High Sensitivity): 5 ng/L (ref <18)

## 2020-03-13 NOTE — ED Triage Notes (Signed)
Patient c/o left lower extremity swelling X several days. Patient c/o medial chest tightness beginning yesterday. Patient denies SOB, nausea.

## 2020-03-14 ENCOUNTER — Emergency Department
Admission: EM | Admit: 2020-03-14 | Discharge: 2020-03-14 | Disposition: A | Discharge status: Home / Self Care | Payer: 59 | Attending: Emergency Medicine | Admitting: Emergency Medicine

## 2020-03-14 HISTORY — DX: Hyperlipidemia, unspecified: E78.5

## 2020-03-14 NOTE — ED Notes (Signed)
Patient called x 3 and this RN walked outside to attempt to locate. Unable to locate.

## 2021-08-11 IMAGING — CR DG CHEST 2V
1 series · 2 of 2 positions shown · non-contrast
Comparison: None.

CLINICAL DATA: Left lower extremity edema for several days, medial
chest tightness since yesterday

EXAM:
CHEST - 2 VIEW

[Series 1: dg chest 2 view · 0.14mm/px · 2 of 2 slices shown]
[im 1/2]
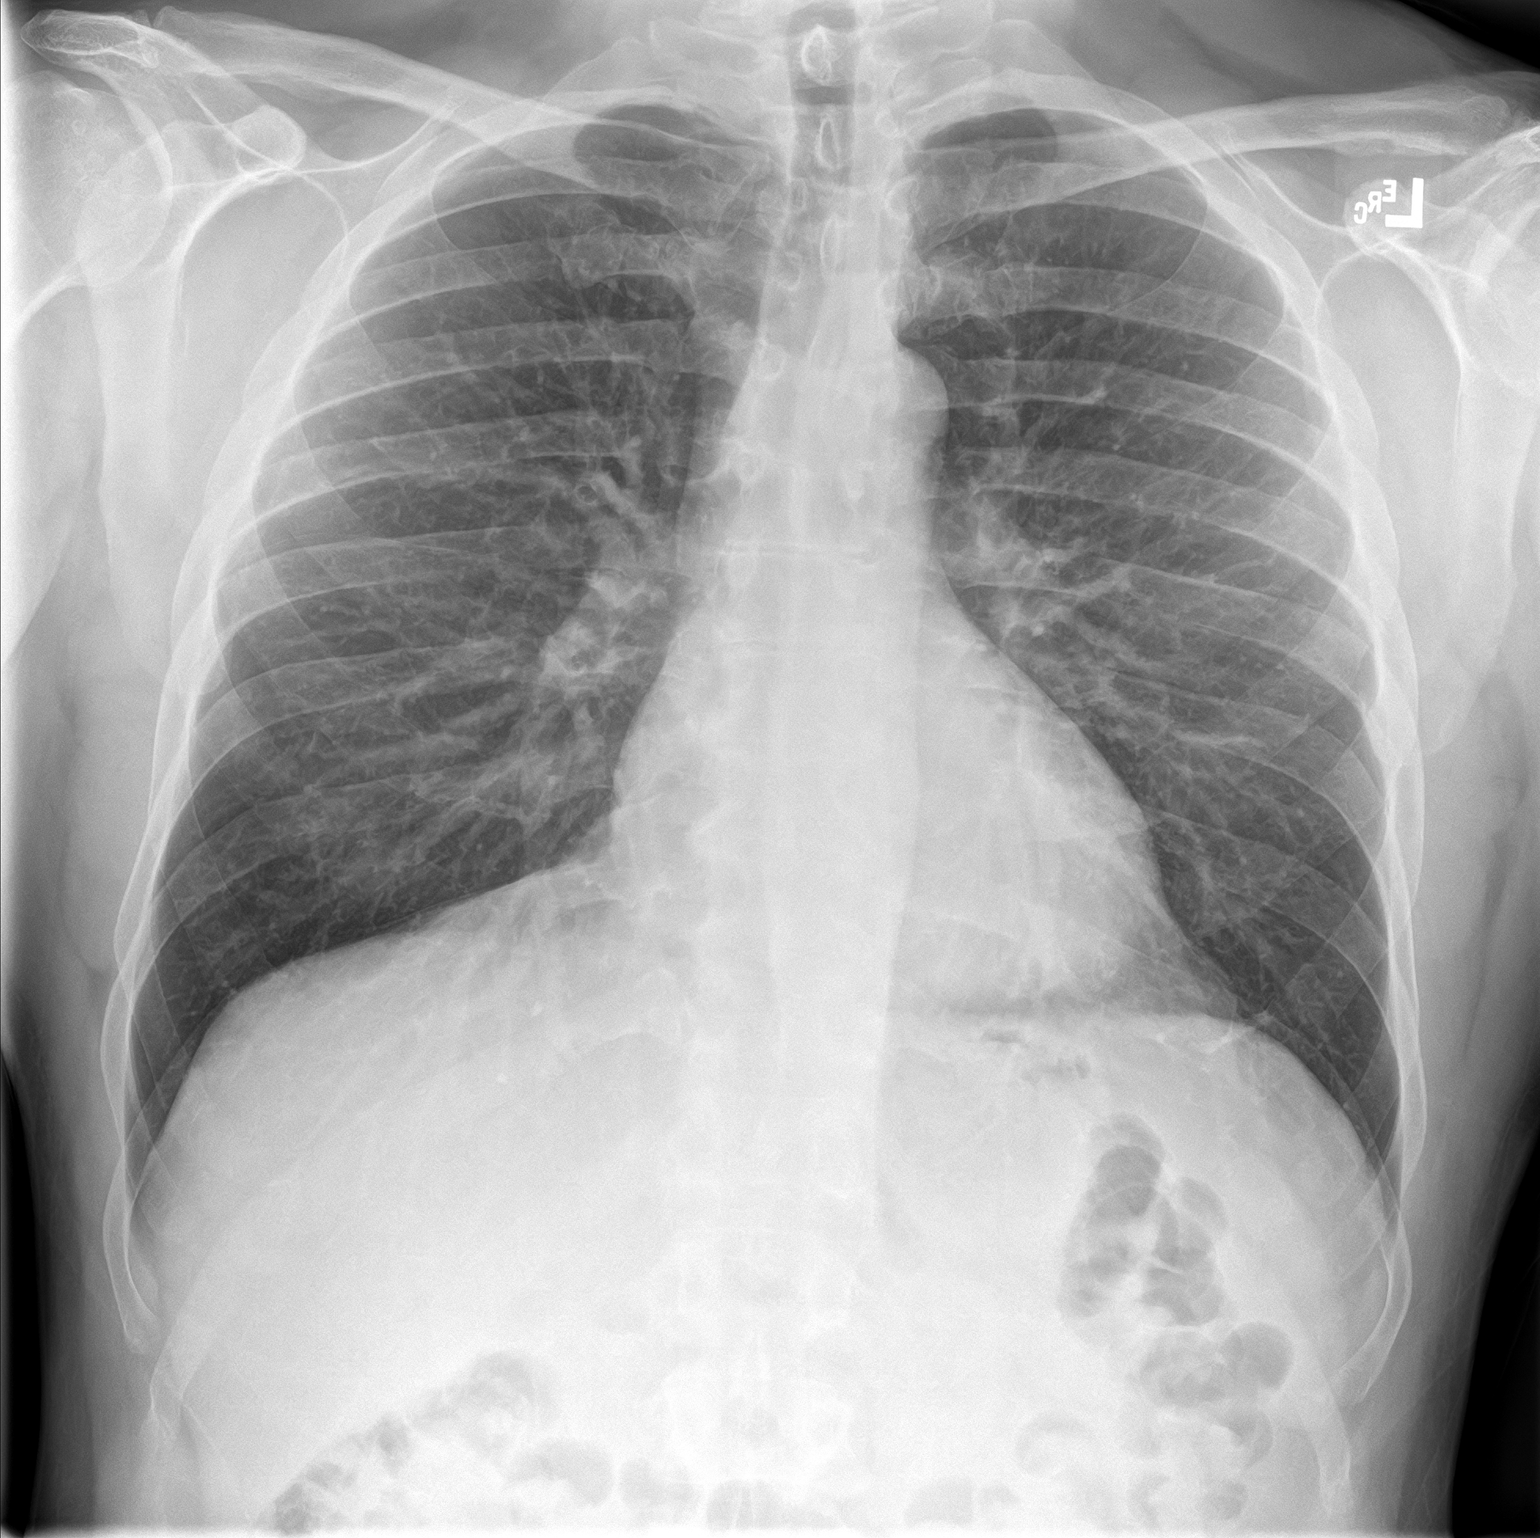
[im 2/2]
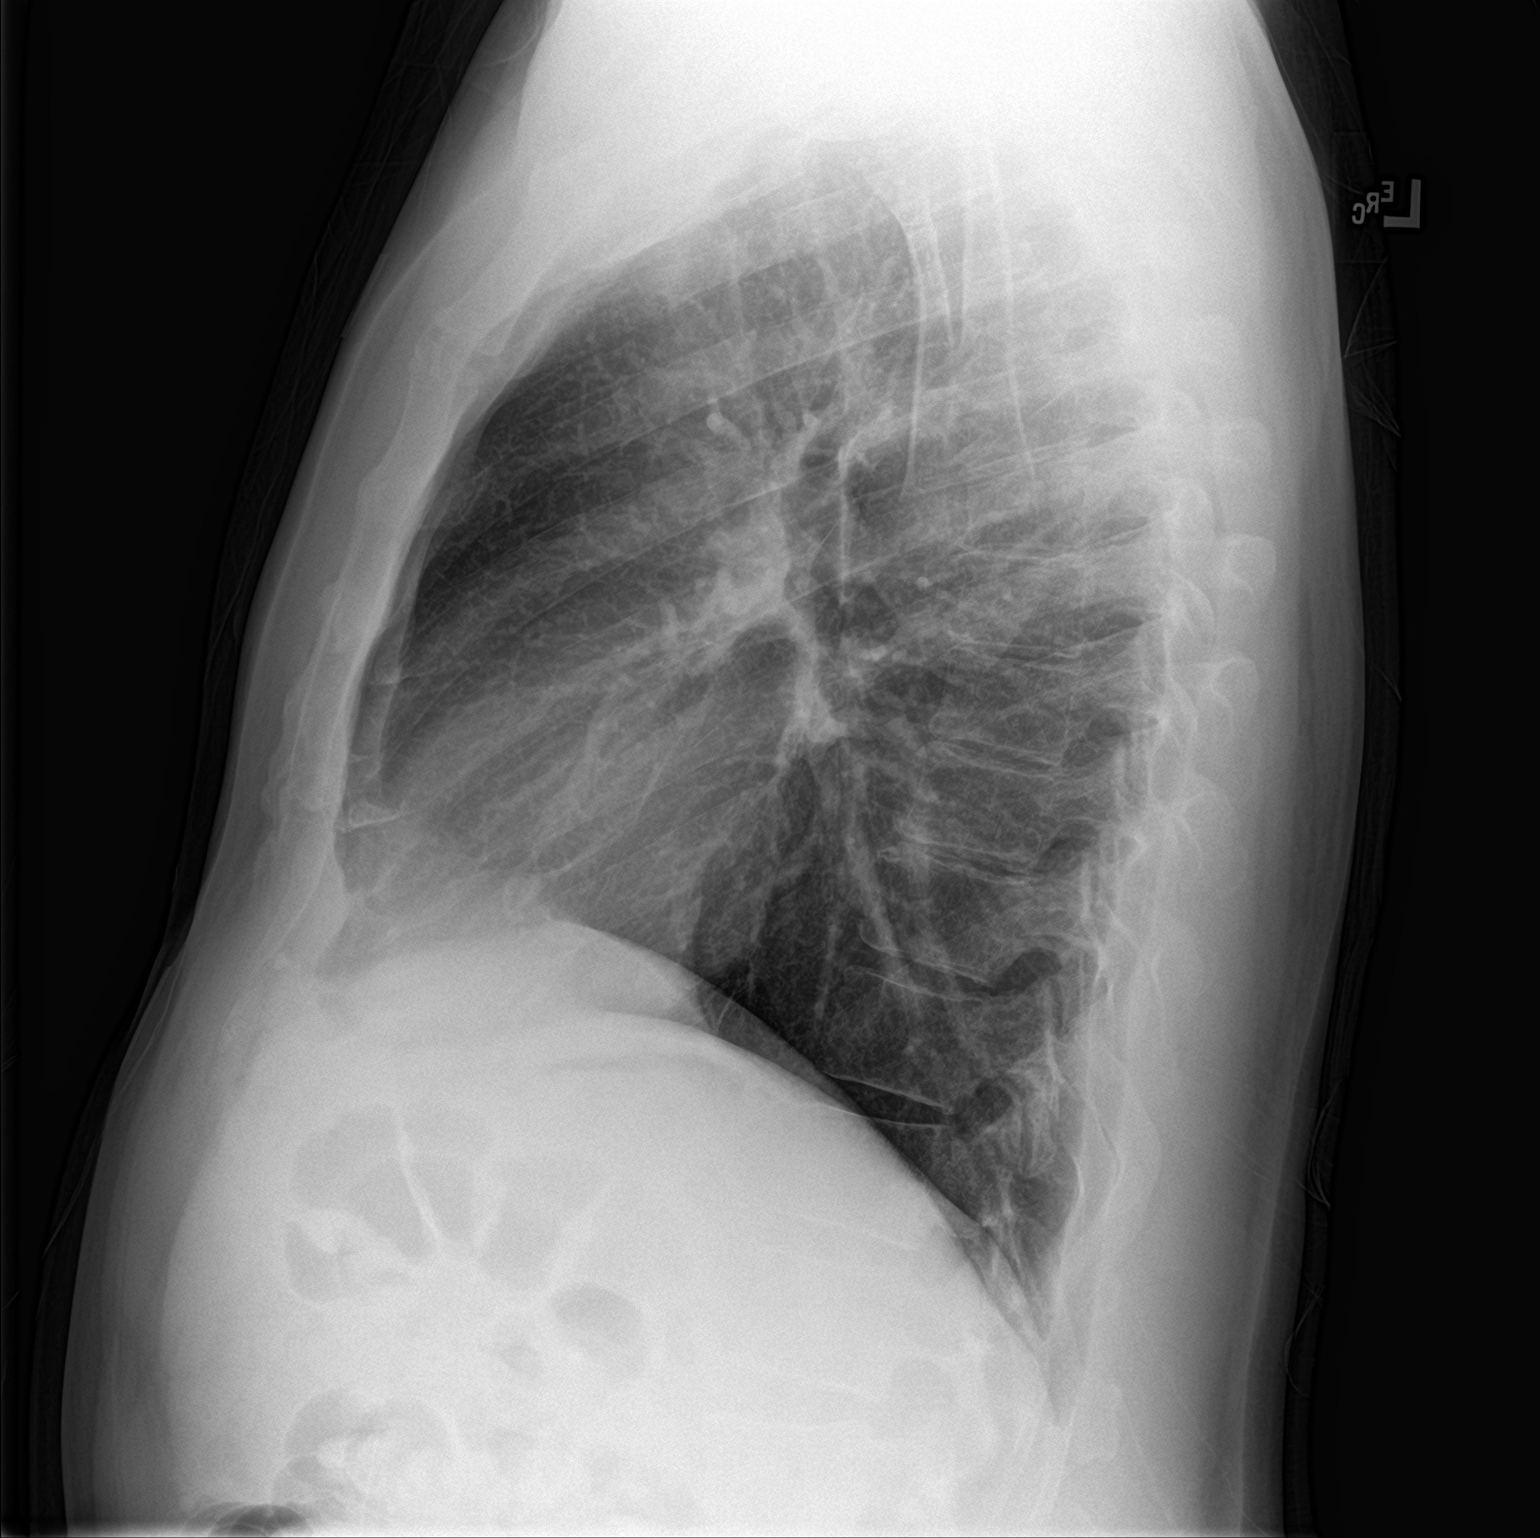

[2 of 2 positions shown; findings below may reference images not displayed]

FINDINGS: The heart size and mediastinal contours are within normal limits.
Both lungs are clear. The visualized skeletal structures are
unremarkable.
IMPRESSION: No active cardiopulmonary disease.

## 2023-02-22 ENCOUNTER — Inpatient Hospital Stay
Admission: EM | Admit: 2023-02-22 | Discharge: 2023-02-25 | DRG: 397 | Disposition: A | Discharge status: Home / Self Care | Payer: No Typology Code available for payment source | Attending: Surgery | Admitting: Surgery

## 2023-02-22 ENCOUNTER — Encounter
Admission: EM | Disposition: A | Discharge status: Home / Self Care | Payer: Self-pay | Source: Home / Self Care | Attending: Surgery

## 2023-02-22 ENCOUNTER — Observation Stay: Payer: No Typology Code available for payment source | Admitting: Anesthesiology

## 2023-02-22 ENCOUNTER — Other Ambulatory Visit: Payer: Self-pay

## 2023-02-22 ENCOUNTER — Emergency Department: Payer: No Typology Code available for payment source

## 2023-02-22 DIAGNOSIS — K3580 Unspecified acute appendicitis: Secondary | ICD-10-CM | POA: Diagnosis not present

## 2023-02-22 DIAGNOSIS — E872 Acidosis, unspecified: Secondary | ICD-10-CM | POA: Diagnosis present

## 2023-02-22 DIAGNOSIS — E876 Hypokalemia: Secondary | ICD-10-CM | POA: Diagnosis present

## 2023-02-22 DIAGNOSIS — K6819 Other retroperitoneal abscess: Secondary | ICD-10-CM | POA: Diagnosis present

## 2023-02-22 DIAGNOSIS — K3533 Acute appendicitis with perforation and localized peritonitis, with abscess: Secondary | ICD-10-CM | POA: Diagnosis not present

## 2023-02-22 DIAGNOSIS — K219 Gastro-esophageal reflux disease without esophagitis: Secondary | ICD-10-CM | POA: Diagnosis present

## 2023-02-22 DIAGNOSIS — K5900 Constipation, unspecified: Secondary | ICD-10-CM | POA: Diagnosis present

## 2023-02-22 DIAGNOSIS — I959 Hypotension, unspecified: Secondary | ICD-10-CM | POA: Diagnosis present

## 2023-02-22 DIAGNOSIS — A419 Sepsis, unspecified organism: Secondary | ICD-10-CM

## 2023-02-22 DIAGNOSIS — R10813 Right lower quadrant abdominal tenderness: Secondary | ICD-10-CM | POA: Diagnosis not present

## 2023-02-22 DIAGNOSIS — E785 Hyperlipidemia, unspecified: Secondary | ICD-10-CM | POA: Diagnosis present

## 2023-02-22 HISTORY — PX: XI ROBOTIC LAPAROSCOPIC ASSISTED APPENDECTOMY: SHX6877

## 2023-02-22 LAB — CBC WITH DIFFERENTIAL/PLATELET
Abs Immature Granulocytes: 0.05 10*3/uL (ref 0.00–0.07)
Basophils Absolute: 0 10*3/uL (ref 0.0–0.1)
Basophils Relative: 0 %
Eosinophils Absolute: 0.2 10*3/uL (ref 0.0–0.5)
Eosinophils Relative: 1 %
HCT: 45.2 % (ref 39.0–52.0)
Hemoglobin: 15.6 g/dL (ref 13.0–17.0)
Immature Granulocytes: 0 %
Lymphocytes Relative: 16 %
Lymphs Abs: 2.4 10*3/uL (ref 0.7–4.0)
MCH: 30.1 pg (ref 26.0–34.0)
MCHC: 34.5 g/dL (ref 30.0–36.0)
MCV: 87.3 fL (ref 80.0–100.0)
Monocytes Absolute: 0.6 10*3/uL (ref 0.1–1.0)
Monocytes Relative: 4 %
Neutro Abs: 11.4 10*3/uL — ABNORMAL HIGH (ref 1.7–7.7)
Neutrophils Relative %: 79 %
Platelets: 416 10*3/uL — ABNORMAL HIGH (ref 150–400)
RBC: 5.18 MIL/uL (ref 4.22–5.81)
RDW: 11.5 % (ref 11.5–15.5)
WBC: 14.6 10*3/uL — ABNORMAL HIGH (ref 4.0–10.5)
nRBC: 0 % (ref 0.0–0.2)

## 2023-02-22 LAB — PROTIME-INR
INR: 1.1 (ref 0.8–1.2)
Prothrombin Time: 14.5 seconds (ref 11.4–15.2)

## 2023-02-22 LAB — GLUCOSE, CAPILLARY: Glucose-Capillary: 107 mg/dL — ABNORMAL HIGH (ref 70–99)

## 2023-02-22 LAB — LACTIC ACID, PLASMA
Lactic Acid, Venous: 2.6 mmol/L (ref 0.5–1.9)
Lactic Acid, Venous: 2.8 mmol/L (ref 0.5–1.9)

## 2023-02-22 LAB — COMPREHENSIVE METABOLIC PANEL
ALT: 22 U/L (ref 0–44)
AST: 27 U/L (ref 15–41)
Albumin: 4.3 g/dL (ref 3.5–5.0)
Alkaline Phosphatase: 95 U/L (ref 38–126)
Anion gap: 15 (ref 5–15)
BUN: 21 mg/dL — ABNORMAL HIGH (ref 6–20)
CO2: 25 mmol/L (ref 22–32)
Calcium: 9 mg/dL (ref 8.9–10.3)
Chloride: 97 mmol/L — ABNORMAL LOW (ref 98–111)
Creatinine, Ser: 1.19 mg/dL (ref 0.61–1.24)
GFR, Estimated: >60 mL/min (ref >60)
Glucose, Bld: 108 mg/dL — ABNORMAL HIGH (ref 70–99)
Potassium: 3.1 mmol/L — ABNORMAL LOW (ref 3.5–5.1)
Sodium: 137 mmol/L (ref 135–145)
Total Bilirubin: 1.4 mg/dL — ABNORMAL HIGH (ref 0.3–1.2)
Total Protein: 8.1 g/dL (ref 6.5–8.1)

## 2023-02-22 LAB — CULTURE, BLOOD (ROUTINE X 2)

## 2023-02-22 LAB — CBG MONITORING, ED: Glucose-Capillary: 62 mg/dL — ABNORMAL LOW (ref 70–99)

## 2023-02-22 LAB — TROPONIN I (HIGH SENSITIVITY): Troponin I (High Sensitivity): 4 ng/L (ref <18)

## 2023-02-22 SURGERY — APPENDECTOMY, ROBOT-ASSISTED, LAPAROSCOPIC
Anesthesia: General | Site: Abdomen

## 2023-02-22 MED ORDER — LIDOCAINE HCL (PF) 2 % IJ SOLN
INTRAMUSCULAR | Status: AC
  Filled 2023-02-22: qty 5

## 2023-02-22 MED ORDER — SODIUM CHLORIDE 0.9 % IV SOLN: INTRAVENOUS | Status: DC

## 2023-02-22 MED ORDER — MIDAZOLAM HCL 2 MG/2ML IJ SOLN
INTRAMUSCULAR | Status: DC | PRN
  Administered 2023-02-22: 2 mg via INTRAVENOUS

## 2023-02-22 MED ORDER — ROCURONIUM BROMIDE 10 MG/ML (PF) SYRINGE
PREFILLED_SYRINGE | INTRAVENOUS | Status: AC
  Filled 2023-02-22: qty 10

## 2023-02-22 MED ORDER — METRONIDAZOLE 500 MG/100ML IV SOLN
500.0000 mg | Freq: Two times a day (BID) | INTRAVENOUS | Status: DC
  Administered 2023-02-22: 500 mg via INTRAVENOUS
  Filled 2023-02-22: qty 100

## 2023-02-22 MED ORDER — OXYCODONE HCL 5 MG PO TABS
5.0000 mg | ORAL_TABLET | ORAL | Status: DC | PRN
  Administered 2023-02-22: 10 mg via ORAL
  Filled 2023-02-22 (×2): qty 2

## 2023-02-22 MED ORDER — LACTATED RINGERS IV BOLUS (SEPSIS)
1000.0000 mL | Freq: Once | INTRAVENOUS | Status: AC
  Administered 2023-02-22: 1000 mL via INTRAVENOUS

## 2023-02-22 MED ORDER — FENTANYL CITRATE (PF) 100 MCG/2ML IJ SOLN: 25.0000 ug | INTRAMUSCULAR | Status: DC | PRN

## 2023-02-22 MED ORDER — DEXMEDETOMIDINE HCL IN NACL 80 MCG/20ML IV SOLN
INTRAVENOUS | Status: DC | PRN
  Administered 2023-02-22 (×3): 8 ug via INTRAVENOUS

## 2023-02-22 MED ORDER — SODIUM CHLORIDE 0.9 % IV SOLN: 2.0000 g | INTRAVENOUS | Status: DC

## 2023-02-22 MED ORDER — DEXAMETHASONE SODIUM PHOSPHATE 10 MG/ML IJ SOLN
INTRAMUSCULAR | Status: DC | PRN
  Administered 2023-02-22: 10 mg via INTRAVENOUS

## 2023-02-22 MED ORDER — SUGAMMADEX SODIUM 200 MG/2ML IV SOLN
INTRAVENOUS | Status: DC | PRN
  Administered 2023-02-22: 200 mg via INTRAVENOUS

## 2023-02-22 MED ORDER — ROCURONIUM BROMIDE 100 MG/10ML IV SOLN
INTRAVENOUS | Status: DC | PRN
  Administered 2023-02-22: 60 mg via INTRAVENOUS

## 2023-02-22 MED ORDER — SODIUM CHLORIDE 0.9 % IV SOLN
1.0000 g | Freq: Once | INTRAVENOUS | Status: AC
  Administered 2023-02-22: 1 g via INTRAVENOUS
  Filled 2023-02-22: qty 10

## 2023-02-22 MED ORDER — DEXTROSE 50 % IV SOLN
12.5000 g | Freq: Once | INTRAVENOUS | Status: AC
  Administered 2023-02-22: 12.5 g via INTRAVENOUS

## 2023-02-22 MED ORDER — ONDANSETRON HCL 4 MG/2ML IJ SOLN
4.0000 mg | Freq: Once | INTRAMUSCULAR | Status: AC
  Administered 2023-02-22: 4 mg via INTRAVENOUS
  Filled 2023-02-22: qty 2

## 2023-02-22 MED ORDER — LIDOCAINE HCL (CARDIAC) PF 100 MG/5ML IV SOSY
PREFILLED_SYRINGE | INTRAVENOUS | Status: DC | PRN
  Administered 2023-02-22: 100 mg via INTRAVENOUS

## 2023-02-22 MED ORDER — MIDAZOLAM HCL 2 MG/2ML IJ SOLN
INTRAMUSCULAR | Status: AC
  Filled 2023-02-22: qty 2

## 2023-02-22 MED ORDER — KETOROLAC TROMETHAMINE 30 MG/ML IJ SOLN
INTRAMUSCULAR | Status: DC | PRN
  Administered 2023-02-22: 30 mg via INTRAVENOUS

## 2023-02-22 MED ORDER — ONDANSETRON HCL 4 MG/2ML IJ SOLN
INTRAMUSCULAR | Status: DC | PRN
  Administered 2023-02-22: 4 mg via INTRAVENOUS

## 2023-02-22 MED ORDER — BUPIVACAINE HCL (PF) 0.25 % IJ SOLN
INTRAMUSCULAR | Status: AC
  Filled 2023-02-22: qty 30

## 2023-02-22 MED ORDER — FENTANYL CITRATE (PF) 100 MCG/2ML IJ SOLN
INTRAMUSCULAR | Status: DC | PRN
  Administered 2023-02-22: 100 ug via INTRAVENOUS

## 2023-02-22 MED ORDER — ACETAMINOPHEN 500 MG PO TABS
1000.0000 mg | ORAL_TABLET | Freq: Four times a day (QID) | ORAL | Status: DC
  Administered 2023-02-22 – 2023-02-23 (×2): 1000 mg via ORAL
  Administered 2023-02-23: 500 mg via ORAL
  Administered 2023-02-23 – 2023-02-25 (×6): 1000 mg via ORAL
  Filled 2023-02-22 (×11): qty 2

## 2023-02-22 MED ORDER — MORPHINE SULFATE (PF) 2 MG/ML IV SOLN
2.0000 mg | INTRAVENOUS | Status: DC | PRN
  Administered 2023-02-22 (×2): 4 mg via INTRAVENOUS
  Filled 2023-02-22 (×2): qty 2

## 2023-02-22 MED ORDER — HYDROMORPHONE HCL 1 MG/ML IJ SOLN
INTRAMUSCULAR | Status: DC | PRN
  Administered 2023-02-22: .5 mg via INTRAVENOUS

## 2023-02-22 MED ORDER — FENTANYL CITRATE PF 50 MCG/ML IJ SOSY
50.0000 ug | PREFILLED_SYRINGE | Freq: Once | INTRAMUSCULAR | Status: DC | PRN
  Filled 2023-02-22: qty 1

## 2023-02-22 MED ORDER — ONDANSETRON 4 MG PO TBDP: 4.0000 mg | ORAL_TABLET | Freq: Four times a day (QID) | ORAL | Status: DC | PRN

## 2023-02-22 MED ORDER — EPINEPHRINE PF 1 MG/ML IJ SOLN
INTRAMUSCULAR | Status: AC
  Filled 2023-02-22: qty 1

## 2023-02-22 MED ORDER — BUPIVACAINE-EPINEPHRINE (PF) 0.25% -1:200000 IJ SOLN
INTRAMUSCULAR | Status: DC | PRN
  Administered 2023-02-22: 28 mL
  Administered 2023-02-22: 2 mL

## 2023-02-22 MED ORDER — PROPOFOL 10 MG/ML IV BOLUS
INTRAVENOUS | Status: AC
  Filled 2023-02-22: qty 40

## 2023-02-22 MED ORDER — OXYCODONE HCL 5 MG PO TABS: 5.0000 mg | ORAL_TABLET | Freq: Once | ORAL | Status: DC | PRN

## 2023-02-22 MED ORDER — BUPIVACAINE LIPOSOME 1.3 % IJ SUSP
INTRAMUSCULAR | Status: AC
  Filled 2023-02-22: qty 20

## 2023-02-22 MED ORDER — SUCCINYLCHOLINE CHLORIDE 200 MG/10ML IV SOSY
PREFILLED_SYRINGE | INTRAVENOUS | Status: DC | PRN
  Administered 2023-02-22: 120 mg via INTRAVENOUS

## 2023-02-22 MED ORDER — KETAMINE HCL 50 MG/5ML IJ SOSY
PREFILLED_SYRINGE | INTRAMUSCULAR | Status: AC
  Filled 2023-02-22: qty 5

## 2023-02-22 MED ORDER — FENTANYL CITRATE PF 50 MCG/ML IJ SOSY
50.0000 ug | PREFILLED_SYRINGE | Freq: Once | INTRAMUSCULAR | Status: AC
  Administered 2023-02-22: 50 ug via INTRAVENOUS

## 2023-02-22 MED ORDER — KETAMINE HCL 10 MG/ML IJ SOLN
INTRAMUSCULAR | Status: DC | PRN
  Administered 2023-02-22: 40 mg via INTRAVENOUS

## 2023-02-22 MED ORDER — ONDANSETRON HCL 4 MG/2ML IJ SOLN
4.0000 mg | Freq: Four times a day (QID) | INTRAMUSCULAR | Status: DC | PRN
  Administered 2023-02-22: 4 mg via INTRAVENOUS
  Filled 2023-02-22: qty 2

## 2023-02-22 MED ORDER — OXYCODONE HCL 5 MG/5ML PO SOLN: 5.0000 mg | Freq: Once | ORAL | Status: DC | PRN

## 2023-02-22 MED ORDER — PROPOFOL 10 MG/ML IV BOLUS
INTRAVENOUS | Status: DC | PRN
  Administered 2023-02-22: 200 mg via INTRAVENOUS

## 2023-02-22 MED ORDER — DEXTROSE 50 % IV SOLN
INTRAVENOUS | Status: AC
  Filled 2023-02-22: qty 50

## 2023-02-22 MED ORDER — SODIUM CHLORIDE 0.9 % IV BOLUS
1000.0000 mL | Freq: Once | INTRAVENOUS | Status: AC
  Administered 2023-02-22: 1000 mL via INTRAVENOUS

## 2023-02-22 MED ORDER — 0.9 % SODIUM CHLORIDE (POUR BTL) OPTIME
TOPICAL | Status: DC | PRN
  Administered 2023-02-22: 3000 mL

## 2023-02-22 MED ORDER — FENTANYL CITRATE (PF) 100 MCG/2ML IJ SOLN
INTRAMUSCULAR | Status: AC
  Filled 2023-02-22: qty 2

## 2023-02-22 MED ORDER — SUCCINYLCHOLINE CHLORIDE 200 MG/10ML IV SOSY
PREFILLED_SYRINGE | INTRAVENOUS | Status: AC
  Filled 2023-02-22: qty 10

## 2023-02-22 MED ORDER — IOHEXOL 300 MG/ML  SOLN
100.0000 mL | Freq: Once | INTRAMUSCULAR | Status: AC | PRN
  Administered 2023-02-22: 100 mL via INTRAVENOUS

## 2023-02-22 MED ORDER — METRONIDAZOLE 500 MG/100ML IV SOLN
500.0000 mg | Freq: Once | INTRAVENOUS | Status: AC
  Administered 2023-02-22: 500 mg via INTRAVENOUS
  Filled 2023-02-22: qty 100

## 2023-02-22 MED ORDER — HYDROMORPHONE HCL 1 MG/ML IJ SOLN
INTRAMUSCULAR | Status: AC
  Filled 2023-02-22: qty 1

## 2023-02-22 SURGICAL SUPPLY — 65 items
ADH SKN CLS APL DERMABOND .7 (GAUZE/BANDAGES/DRESSINGS) ×1
BAG PRESSURE INF REUSE 3000 (BAG) IMPLANT
BLADE CLIPPER SURG (BLADE) IMPLANT
BULB RESERV EVAC DRAIN JP 100C (MISCELLANEOUS) IMPLANT
CANNULA REDUCER 12-8 DVNC XI (CANNULA) IMPLANT
COVER TIP SHEARS 8 DVNC (MISCELLANEOUS) ×1 IMPLANT
CUTTER FLEX LINEAR 45M (STAPLE) IMPLANT
DERMABOND ADVANCED .7 DNX12 (GAUZE/BANDAGES/DRESSINGS) ×1 IMPLANT
DRAIN CHANNEL 19F RND (DRAIN) IMPLANT
DRAPE ARM DVNC X/XI (DISPOSABLE) ×3 IMPLANT
DRAPE COLUMN DVNC XI (DISPOSABLE) ×1 IMPLANT
DRSG TEGADERM 4X4.75 (GAUZE/BANDAGES/DRESSINGS) IMPLANT
FORCEPS BPLR R/ABLATION 8 DVNC (INSTRUMENTS) ×1 IMPLANT
GLOVE ORTHO TXT STRL SZ7.5 (GLOVE) ×2 IMPLANT
GOWN STRL REUS W/ TWL LRG LVL3 (GOWN DISPOSABLE) ×1 IMPLANT
GOWN STRL REUS W/ TWL XL LVL3 (GOWN DISPOSABLE) ×2 IMPLANT
GOWN STRL REUS W/TWL LRG LVL3 (GOWN DISPOSABLE) ×1
GOWN STRL REUS W/TWL XL LVL3 (GOWN DISPOSABLE) ×2
GRASPER SUT TROCAR 14GX15 (MISCELLANEOUS) IMPLANT
GRASPER TIP-UP FEN DVNC XI (INSTRUMENTS) ×1 IMPLANT
IRRIGATION STRYKERFLOW (MISCELLANEOUS) IMPLANT
IRRIGATOR STRYKERFLOW (MISCELLANEOUS)
IRRIGATOR SUCT 8 DISP DVNC XI (IRRIGATION / IRRIGATOR) IMPLANT
IV NS IRRIG 3000ML ARTHROMATIC (IV SOLUTION) IMPLANT
KIT PINK PAD W/HEAD ARE REST (MISCELLANEOUS) ×1 IMPLANT
KIT PINK PAD W/HEAD ARM REST (MISCELLANEOUS) ×1 IMPLANT
KIT TURNOVER KIT A (KITS) ×1 IMPLANT
LABEL OR SOLS (LABEL) ×1 IMPLANT
MANIFOLD NEPTUNE II (INSTRUMENTS) ×1 IMPLANT
NDL DRIVE SUT CUT DVNC (INSTRUMENTS) ×1 IMPLANT
NDL HYPO 22X1.5 SAFETY MO (MISCELLANEOUS) ×1 IMPLANT
NDL INSUFFLATION 14GA 120MM (NEEDLE) IMPLANT
NEEDLE DRIVE SUT CUT DVNC (INSTRUMENTS) ×1 IMPLANT
NEEDLE HYPO 22X1.5 SAFETY MO (MISCELLANEOUS) ×1 IMPLANT
NEEDLE INSUFFLATION 14GA 120MM (NEEDLE) ×1 IMPLANT
NS IRRIG 500ML POUR BTL (IV SOLUTION) ×1 IMPLANT
PACK LAP CHOLECYSTECTOMY (MISCELLANEOUS) ×1 IMPLANT
RELOAD 45 VASCULAR/THIN (ENDOMECHANICALS) IMPLANT
RELOAD STAPLE 45 2.5 WHT DVNC (STAPLE) IMPLANT
RELOAD STAPLE 45 2.5 WHT GRN (ENDOMECHANICALS) IMPLANT
RELOAD STAPLE 45 3.6 BLU REG (STAPLE) IMPLANT
RELOAD STAPLER 2.5X45 WHT DVNC (STAPLE) IMPLANT
SCISSORS MNPLR CVD DVNC XI (INSTRUMENTS) ×1 IMPLANT
SEAL UNIV 5-12 XI (MISCELLANEOUS) ×3 IMPLANT
SET TUBE SMOKE EVAC HIGH FLOW (TUBING) ×1 IMPLANT
SPIKE FLUID TRANSFER (MISCELLANEOUS) ×1 IMPLANT
SPONGE DRAIN TRACH 4X4 STRL 2S (GAUZE/BANDAGES/DRESSINGS) IMPLANT
STAPLE RELOAD 45MM BLUE (STAPLE) ×1 IMPLANT
STAPLER 45 SUREFORM DVNC (STAPLE) IMPLANT
STAPLER RELOAD 2.5X45 WHT DVNC (STAPLE)
SUT ETHILON 3-0 FS-10 30 BLK (SUTURE) ×1
SUT MNCRL 4-0 (SUTURE) ×1
SUT MNCRL 4-0 27XMFL (SUTURE) ×1
SUT VIC AB 2-0 SH 27 (SUTURE) ×1
SUT VIC AB 2-0 SH 27XBRD (SUTURE) ×1 IMPLANT
SUT VICRYL 0 UR6 27IN ABS (SUTURE) ×1 IMPLANT
SUTURE EHLN 3-0 FS-10 30 BLK (SUTURE) IMPLANT
SUTURE MNCRL 4-0 27XMF (SUTURE) ×1 IMPLANT
SYS BAG RETRIEVAL 10MM (BASKET) ×1
SYSTEM BAG RETRIEVAL 10MM (BASKET) ×1 IMPLANT
TRAP FLUID SMOKE EVACUATOR (MISCELLANEOUS) ×1 IMPLANT
TRAP SPECIMEN MUCUS 40CC (MISCELLANEOUS) IMPLANT
TRAY FOLEY SLVR 16FR LF STAT (SET/KITS/TRAYS/PACK) ×1 IMPLANT
TROCAR Z-THREAD FIOS 12X100MM (TROCAR) ×1 IMPLANT
WATER STERILE IRR 500ML POUR (IV SOLUTION) ×1 IMPLANT

## 2023-02-22 NOTE — Transfer of Care (Signed)
Immediate Anesthesia Transfer of Care Note  Patient: [name]  Procedure(s) Performed: XI ROBOTIC LAPAROSCOPIC ASSISTED APPENDECTOMY (Abdomen)  Patient Location: PACU  Anesthesia Type:General  Level of Consciousness: drowsy  Airway & Oxygen Therapy: Patient Spontanous Breathing and Patient connected to face mask oxygen  Post-op Assessment: Report given to RN, Post -op Vital signs reviewed and stable, and Patient moving all extremities  Post vital signs: Reviewed and stable  Last Vitals:  Vitals Value Taken Time  BP 87/58 02/22/23 2333  Temp 36.3 C 02/22/23 2333  Pulse 60 02/22/23 2337  Resp 14 02/22/23 2337  SpO2 96 % 02/22/23 2337  Vitals shown include unvalidated device data.  Last Pain:  Vitals:   02/22/23 2001  TempSrc:   PainSc: 2          Complications: No notable events documented.

## 2023-02-22 NOTE — Interval H&P Note (Signed)
History and Physical Interval Note:  02/22/2023 9:29 PM  John Medina  has presented today for surgery, with the diagnosis of acute appendicitis.  The various methods of treatment have been discussed with the patient and family. After consideration of risks, benefits and other options for treatment, the patient has consented to  Procedure(s): XI ROBOTIC LAPAROSCOPIC ASSISTED APPENDECTOMY (N/A) as a surgical intervention.  The patient's history has been reviewed, patient examined, no change in status, stable for surgery.  I have reviewed the patient's chart and labs.  Questions were answered to the patient's satisfaction.     Campbell Lerner

## 2023-02-22 NOTE — Anesthesia Preprocedure Evaluation (Signed)
Anesthesia Evaluation  Patient identified by MRN, date of birth, ID band Patient awake    Reviewed: Allergy & Precautions, NPO status , Patient's Chart, lab work & pertinent test results  History of Anesthesia Complications Negative for: history of anesthetic complications  Airway Mallampati: III  TM Distance: >3 FB Neck ROM: full    Dental  (+) Chipped   Pulmonary neg pulmonary ROS, neg shortness of breath   Pulmonary exam normal        Cardiovascular Exercise Tolerance: Good (-) angina (-) Past MI and (-) DOE negative cardio ROS Normal cardiovascular exam     Neuro/Psych negative neurological ROS  negative psych ROS   GI/Hepatic negative GI ROS, Neg liver ROS,neg GERD  ,,  Endo/Other  negative endocrine ROS    Renal/GU      Musculoskeletal   Abdominal   Peds  Hematology negative hematology ROS (+)   Anesthesia Other Findings Past Medical History: No date: Hyperlipidemia  No past surgical history on file.  BMI    Body Mass Index: 28.70 kg/m      Reproductive/Obstetrics negative OB ROS                             Anesthesia Physical Anesthesia Plan  ASA: 2 and emergent  Anesthesia Plan: General ETT   Post-op Pain Management:    Induction: Intravenous  PONV Risk Score and Plan: Ondansetron, Dexamethasone, Midazolam and Treatment may vary due to age or medical condition  Airway Management Planned: Oral ETT  Additional Equipment:   Intra-op Plan:   Post-operative Plan: Extubation in OR  Informed Consent: I have reviewed the patients History and Physical, chart, labs and discussed the procedure including the risks, benefits and alternatives for the proposed anesthesia with the patient or authorized representative who has indicated his/her understanding and acceptance.     Dental Advisory Given  Plan Discussed with: Anesthesiologist, CRNA and Surgeon  Anesthesia  Plan Comments: (Patient consented for risks of anesthesia including but not limited to:  - adverse reactions to medications - damage to eyes, teeth, lips or other oral mucosa - nerve damage due to positioning  - sore throat or hoarseness - Damage to heart, brain, nerves, lungs, other parts of body or loss of life  Patient voiced understanding.)       Anesthesia Quick Evaluation

## 2023-02-22 NOTE — ED Provider Notes (Signed)
[facility] Provider Note    Event Date/Time   First MD Initiated Contact with Patient 02/22/23 1327     (approximate)   History   Code Sepsis (Abdominal pain that started 3 days ago; Patient is pale and hypotensive during triage; Sepsis workup initiated)   HPI  John Medina is a 57 y.o. male   Past medical history of hyperlipidemia and cervical spinal stenosis who presents to the emergency department with abdominal pain.  Has had constipation and vague abdominal pain for the last 3 days, right-sided more than left and then acutely worsened today.  No fever or chills.  No changes in bowel movements, no GI bleeding, no dysuria or testicular pain.  No history of abdominal surgeries.  Has had poor p.o. intake over the last several days due to nausea but no vomiting associated with his abdominal pain.        Physical Exam   Triage Vital Signs: ED Triage Vitals  Enc Vitals Group     BP 02/22/23 1318 (!) 88/62     Pulse Rate 02/22/23 1318 84     Resp 02/22/23 1318 19     Temp 02/22/23 1318 98 F (36.7 C)     Temp Source 02/22/23 1318 Oral     SpO2 02/22/23 1318 100 %     Weight 02/22/23 1317 200 lb (90.7 kg)     Height 02/22/23 1317 5\' 10"  (1.778 m)     Head Circumference --      Peak Flow --      Pain Score --      Pain Loc --      Pain Edu? --      Excl. in GC? --     Most recent vital signs: Vitals:   02/22/23 1318 02/22/23 1334  BP: (!) 88/62 133/62  Pulse: 84 74  Resp: 19   Temp: 98 F (36.7 C)   SpO2: 100% 96%    General: Awake, no distress.  CV:  Good peripheral perfusion.  Resp:  Normal effort.  Abd:  No distention.  Other:  Awake alert uncomfortable appearing initially hypotensive.  Abdomen diffusely tender to palpation with involuntary guarding.   ED Results / Procedures / Treatments   Labs (all labs ordered are listed, but only abnormal results are displayed) Labs Reviewed  COMPREHENSIVE METABOLIC PANEL -  Abnormal; Notable for the following components:      Result Value   Potassium 3.1 (*)    Chloride 97 (*)    Glucose, Bld 108 (*)    BUN 21 (*)    Total Bilirubin 1.4 (*)    All other components within normal limits  LACTIC ACID, PLASMA - Abnormal; Notable for the following components:   Lactic Acid, Venous 2.6 (*)    All other components within normal limits  CBC WITH DIFFERENTIAL/PLATELET - Abnormal; Notable for the following components:   WBC 14.6 (*)    Platelets 416 (*)    Neutro Abs 11.4 (*)    All other components within normal limits  CBG MONITORING, ED - Abnormal; Notable for the following components:   Glucose-Capillary 62 (*)    All other components within normal limits  CULTURE, BLOOD (ROUTINE X 2)  CULTURE, BLOOD (ROUTINE X 2)  PROTIME-INR  LACTIC ACID, PLASMA  LIPASE, BLOOD  TROPONIN I (HIGH SENSITIVITY)  TROPONIN I (HIGH SENSITIVITY)     I ordered and reviewed the above labs they are notable for white blood cell  count is 14.6 and has a lactic acidosis at 2.6 and an initial blood glucose of 62.  EKG  ED ECG REPORT I, Pilar Jarvis, the attending physician, personally viewed and interpreted this ECG.   Date: 02/22/2023  EKG Time: 1312  Rate: 63  Rhythm: nsr  Axis: nl  Intervals:non  ST&T Change: no stemi    RADIOLOGY I independently reviewed and interpreted CT scan of the abdomen pelvis and see some inflammatory changes in the right lower quadrant   PROCEDURES:  Critical Care performed: Yes, see critical care procedure note(s)  .Critical Care  Performed by: Pilar Jarvis, MD Authorized by: Pilar Jarvis, MD   Critical care provider statement:    Critical care time (minutes):  30   Critical care was time spent personally by me on the following activities:  Development of treatment plan with patient or surrogate, discussions with consultants, evaluation of patient's response to treatment, examination of patient, ordering and review of laboratory studies,  ordering and review of radiographic studies, ordering and performing treatments and interventions, pulse oximetry, re-evaluation of patient's condition and review of old charts    MEDICATIONS ORDERED IN ED: Medications  fentaNYL (SUBLIMAZE) injection 50 mcg (has no administration in time range)  metroNIDAZOLE (FLAGYL) IVPB 500 mg (500 mg Intravenous New Bag/Given 02/22/23 1448)  lactated ringers bolus 1,000 mL (has no administration in time range)  sodium chloride 0.9 % bolus 1,000 mL (1,000 mLs Intravenous New Bag/Given 02/22/23 1316)  dextrose 50 % solution 12.5 g (12.5 g Intravenous Given 02/22/23 1316)  lactated ringers bolus 1,000 mL (1,000 mLs Intravenous New Bag/Given 02/22/23 1354)  fentaNYL (SUBLIMAZE) injection 50 mcg (50 mcg Intravenous Given 02/22/23 1355)  ondansetron (ZOFRAN) injection 4 mg (4 mg Intravenous Given 02/22/23 1354)  iohexol (OMNIPAQUE) 300 MG/ML solution 100 mL (100 mLs Intravenous Contrast Given 02/22/23 1400)  cefTRIAXone (ROCEPHIN) 1 g in sodium chloride 0.9 % 100 mL IVPB (1 g Intravenous New Bag/Given 02/22/23 1441)    External physician / consultants:  I spoke with Laqueta Due physician assistant to Dr. Claudine Mouton of general surgery regarding care plan for this patient.   IMPRESSION / MDM / ASSESSMENT AND PLAN / ED COURSE  I reviewed the triage vital signs and the nursing notes.                                Patient's presentation is most consistent with acute presentation with potential threat to life or bodily function.  Differential diagnosis includes, but is not limited to, acute appendicitis, complicated appendicitis, ruptured appendicitis, diverticulitis, ruptured viscus, UTI, AAA   The patient is on the cardiac monitor to evaluate for evidence of arrhythmia and/or significant heart rate changes.  MDM: Patient with worsening right lower quadrant abdominal pain concern for appendicitis or other surgical abdominal pathologies like infection,  obstruction, perforated viscus.   Concern for sepsis given hypotension, lactic acidosis, white blood cell count, so he was ordered for 30 cc/kg ideal body weight IV crystalloid bolus as well as empiric antibiotics to cover for intra-abdominal infection most likely with ceftriaxone and Flagyl.  IV fentanyl for pain control.  IV Zofran for antiemetic.    --- Acute appendicitis on CT scan.  Patient reassessed at this time with vital signs much improved, comfortable, pain under control, and surgery was consulted for admission.         FINAL CLINICAL IMPRESSION(S) / ED DIAGNOSES   Final diagnoses:  Acute  appendicitis, unspecified acute appendicitis type     Rx / DC Orders   ED Discharge Orders     None        Note:  This document was prepared using Dragon voice recognition software and may include unintentional dictation errors.    Pilar Jarvis, MD 02/22/23 [phone]

## 2023-02-22 NOTE — Anesthesia Procedure Notes (Signed)
Procedure Name: Intubation Date/Time: 02/22/2023 9:31 PM  Performed by: Katherine Basset, CRNAPre-anesthesia Checklist: Patient identified, Emergency Drugs available, Suction available and Patient being monitored Patient Re-evaluated:Patient Re-evaluated prior to induction Oxygen Delivery Method: Circle system utilized Preoxygenation: Pre-oxygenation with 100% oxygen Induction Type: IV induction and Rapid sequence Laryngoscope Size: Miller and 2 Grade View: Grade II Tube type: Oral Tube size: 7.5 mm Number of attempts: 1 Airway Equipment and Method: Stylet, Oral airway and Bite block Placement Confirmation: ETT inserted through vocal cords under direct vision, positive ETCO2 and breath sounds checked- equal and bilateral Secured at: 22 cm Tube secured with: Tape Dental Injury: Teeth and Oropharynx as per pre-operative assessment

## 2023-02-22 NOTE — Progress Notes (Addendum)
Patient arrived to unit. Patient reporting pain 10/10 to ABD and nausea. PRN zofran given. Scheduled tylenol given. He does not want oxycodone at this time. NS and ABX infusing. Family is at the bedside. Wife and son concerned about dad- all concerns talked about with therapeutic communication.  Surgeon reports surgery should be about 9ish. Patient is understandably trying to relax and comfort self using warm blanket, dim lighting and pain meds- unable to answer admission questions at this time. Night shift will f/u.

## 2023-02-22 NOTE — Discharge Instructions (Addendum)
In addition to included general post-operative instructions,  Diet: Resume home diet.   Activity: No heavy lifting >20 pounds (children, pets, laundry, garbage) or strenuous activity for 4 weeks, but light activity and walking are encouraged. Do not drive or drink alcohol if taking narcotic pain medications or having pain that might distract from driving.  Drain: Monitor and record output daily; hand out given for this. Please bring this to your follow up appointment   Wound care: 2 days after surgery, you may shower/get incision wet with soapy water and pat dry (do not rub incisions), but no baths or submerging incision underwater until follow-up.   Medications: Resume all home medications. For mild to moderate pain: acetaminophen (Tylenol) or ibuprofen/naproxen (if no kidney disease). Combining Tylenol with alcohol can substantially increase your risk of causing liver disease. Narcotic pain medications, if prescribed, can be used for severe pain, though may cause nausea, constipation, and drowsiness. Do not combine Tylenol and Percocet (or similar) within a 6 hour period as Percocet (and similar) contain(s) Tylenol. If you do not need the narcotic pain medication, you do not need to fill the prescription.  Call office [phone] / [phone]) at any time if any questions, worsening pain, fevers/chills, bleeding, drainage from incision site, or other concerns.

## 2023-02-22 NOTE — Op Note (Addendum)
Robotic appendectomy  Pre-operative Diagnosis: Acute appendicitis  Post-operative Diagnosis: Acute perforated appendicitis with abscess.  Surgeon: Campbell Lerner, M.D., FACS  Anesthesia: General  Findings: Wrapped appendix with adjacent distal ileum, retroperitoneal abscess extension.  Estimated Blood Loss: 25 mL         Specimens:  Appendix, purulent fluid from appendiceal abscess for culture and sensitivity.          Complications: none              Procedure Details  The patient was seen again in the Holding Room. The benefits, complications, treatment options, and expected outcomes were discussed with the patient. The risks of bleeding, infection, recurrence of symptoms, failure to resolve symptoms, unanticipated injury, prosthetic placement, prosthetic infection, any of which could require further surgery were reviewed with the patient. The likelihood of improving the patient's symptoms with return to their baseline status is anticipated.  The patient and/or family concurred with the proposed plan, giving informed consent.  The patient was taken to Operating Room, identified and the procedure verified.    Prior to the induction of general anesthesia, antibiotic prophylaxis was administered. VTE prophylaxis was in place. GETA was then administered and tolerated well. After the induction, the patient was positioned in the supine position and the abdomen was prepped with Chloraprep and draped in the sterile fashion.  A Time Out was held and the above information confirmed. After local infiltration of quarter percent Marcaine with epinephrine, stab incision was made left upper quadrant.  Just below the costal margin approximately midclavicular line the Veress needle is passed with sensation of the layers to penetrate the abdominal wall and into the peritoneum.  Saline drop test is confirmed peritoneal placement.  Insufflation is initiated with carbon dioxide to pressures of 15 mmHg.  With  local anesthetic infiltration, a left lower quadrant incision is made, and an optical 12 mm trocar is passed into the peritoneal cavity under direct visualization.  Additional 8.5 mm robotic trochars placed in the suprapubic area and in the right and left abdominal wall under direct visualization. It was quite evident that there was an adhesive inflammatory process in the right lower quadrant, there was the petechial sort of peritoneal inflammation in multiple areas including the adjacent serosa.  I began with blunt dissection to gently mobilize some of the adhesions adhesions to the region.  Immediately identifying purulent drainage coming from the presumed phlegmon noted on CT.    Photos taken.   Retroperitoneal abscess is clinically supported and the clinical data/criteria used for diagnosing is the operative findings of the abscess cavity involving/extending into the retroperitoneal tissues as noted in the photos.    Suction irrigator was utilized to aspirate and obtain a good sample for culture and sensitivity.  I then proceeded to utilize the force bipolar, and the suction irrigator to obtain blunt dissection and separate the adhesive distal loop of small bowel and the marked inflammatory veil of Treves.  I went throughout the the pelvis, and between the adjacent loops of small bowel as well as the right lower quadrant abscess cavity that appeared to be deep to the peritoneum.  And also in the right upper quadrant to aspirate every bit of purulent fluid feasible. Using a Tips up, & force bipolar graspers with monopolar scissors I proceeded with dissecting out the soft tissues adjacent to the cecum and appendix to fully identify the appendix, and mobilize it to the cecal junction. Multiple photos taken to describe the process.  The appendiceal  junction with the cecum was quite healthy and viable, and I knew would tolerate a staple line. The mesoappendix was carefully divided utilizing bipolar cautery,  monopolar cautery and scissors. With the appendiceal cecal junction fully isolated.  We then undocked the robot and proceeded with completing the procedure laparoscopically. I used a 45 mm blue load laparoscopic stapler to complete the division of the appendix at the cecum.  We then placed the appendix in a retrieval bag and withdrew it out the largest port site. We closed the largest port site utilizing PMI and cone with a 0 Vicryl under direct visualization.  3 L of normal saline solution were utilized to irrigate the abdomen, cleansing any residual purulent drainage to clarity. I placed a 19 Blake drain via the suprapubic trocar site, looped it from pelvis to right lower quadrant along the right colic gutter.  It was secured to the skin with 3-0 nylon. The abdomen was then desufflated and the trochars removed.   Incisions were then irrigated and closed with subcuticulars of 4-0 Monocryl.  Skin sealed with Dermabond.  Patient tolerated procedure well.    Campbell Lerner M.D., [facility] 02/22/2023 11:45 PM

## 2023-02-22 NOTE — H&P (Signed)
[facility] SURGICAL HISTORY & PHYSICAL (cpt [phone])  HISTORY OF PRESENT ILLNESS (HPI):  57 y.o. male presented to [facility] ED today for abdominal pain. Patient reports around 2-3 days of vague abdominal pain but this acutely worsened in the last 24 hours. He reports this started around his umbilicus and localized to the RLQ. He reports accompanying chills and nausea. No fever, cough, CP, SOB, emesis, or urinary changes. Of course, no history of similar. No previous intra-abdominal procedures reported. Work up in the ED revealed a leukocytosis to 14.6K, Hgb to 15.6, sCr - 1.19 which is stable from 2 years ago, hypokalemia to 2.1, and mild elevation in venous lactate to 2.6. CT Abdomen/Pelvis was obtained and concerning for acute appendicitis with inflammatory response about the appendix, no free air, no gross abscess   General surgery is consulted by emergency medicine physician Dr Pilar Jarvis, MD for evaluation and management of acute appendicitis   PAST MEDICAL HISTORY (PMH):  Past Medical History:  Diagnosis Date   Hyperlipidemia     Reviewed. Otherwise negative.   PAST SURGICAL HISTORY (PSH):  No past surgical history on file.  Reviewed. Otherwise negative.   MEDICATIONS:  Prior to Admission medications   Medication Sig Start Date End Date Taking? Authorizing Provider  ibuprofen (ADVIL,MOTRIN) 200 MG tablet Take 400-600 mg by mouth every 8 (eight) hours as needed for headache or mild pain (depends on pain level if takes 2-3 tablets).    [provider]  triamterene-hydrochlorothiazide (MAXZIDE-25) 37.5-25 MG tablet Take 1 tablet by mouth daily. 06/21/17   [provider]  Turmeric 500 MG CAPS Take 500 mg by mouth daily.    [provider]     ALLERGIES:  No Known Allergies   SOCIAL HISTORY:  Social History   Socioeconomic History   Marital status: Married    Spouse name: Not on file   Number of children: Not on file   Years of education:  Not on file   Highest education level: Not on file  Occupational History   Not on file  Tobacco Use   Smoking status: Never   Smokeless tobacco: Never  Substance and Sexual Activity   Alcohol use: Yes   Drug use: Not on file   Sexual activity: Not on file  Other Topics Concern   Not on file  Social History Narrative   Not on file   Social Determinants of Health   Financial Resource Strain: Not on file  Food Insecurity: Not on file  Transportation Needs: Not on file  Physical Activity: Not on file  Stress: Not on file  Social Connections: Not on file  Intimate Partner Violence: Not on file     FAMILY HISTORY:  No family history on file.  Otherwise negative.   REVIEW OF SYSTEMS:  Review of Systems  Constitutional:  Positive for chills. Negative for fever.  Respiratory:  Negative for cough and shortness of breath.   Cardiovascular:  Negative for chest pain and palpitations.  Gastrointestinal:  Positive for abdominal pain and nausea. Negative for constipation, diarrhea and vomiting.  Genitourinary:  Negative for dysuria and urgency.  All other systems reviewed and are negative.   VITAL SIGNS:  Temp:  [98 F (36.7 C)] 98 F (36.7 C) (06/19 1318) Pulse Rate:  [74-84] 74 (06/19 1334) Resp:  [19] 19 (06/19 1318) BP: (88-133)/(62) 133/62 (06/19 1334) SpO2:  [96 %-100 %] 96 % (06/19 1334) Weight:  [90.7 kg] 90.7 kg (06/19 1317)  Height: 5\' 10"  (177.8 cm) Weight: 90.7 kg BMI (Calculated): 28.7   PHYSICAL EXAM:  Physical Exam Vitals and nursing note reviewed. Exam conducted with a chaperone present.  Constitutional:      General: He is not in acute distress.    Appearance: Normal appearance. He is not ill-appearing.     Comments: Resting in bed; NAD; family at bedside   HENT:     Head: Normocephalic and atraumatic.  Eyes:     General: No scleral icterus.    Conjunctiva/sclera: Conjunctivae normal.  Cardiovascular:     Rate and Rhythm: Normal rate.     Pulses:  Normal pulses.  Pulmonary:     Effort: Pulmonary effort is normal. No respiratory distress.  Abdominal:     General: There is no distension.     Palpations: Abdomen is soft.     Tenderness: There is abdominal tenderness in the right lower quadrant. There is no guarding or rebound. Positive signs include Rovsing's sign and McBurney's sign.     Comments: Abdomen is markedly tender in RLQ with grossly positive Rovsing and McBurney, non-distended  Genitourinary:    Comments: Deferred Musculoskeletal:     Right lower leg: No edema.     Left lower leg: No edema.  Skin:    General: Skin is warm and dry.     Findings: No erythema.  Neurological:     General: No focal deficit present.     Mental Status: He is alert and oriented to person, place, and time.  Psychiatric:        Mood and Affect: Mood normal.        Behavior: Behavior normal.     INTAKE/OUTPUT:  This shift: No intake/output data recorded.  Last 2 shifts: @IOLAST2SHIFTS @  Labs:     Latest Ref Rng & Units 02/22/2023    1:15 PM 03/13/2020   10:35 PM  CBC  WBC 4.0 - 10.5 K/uL 14.6  7.4   Hemoglobin 13.0 - 17.0 g/dL 09.8  11.9   Hematocrit 39.0 - 52.0 % 45.2  43.1   Platelets 150 - 400 K/uL 416  272       Latest Ref Rng & Units 02/22/2023    1:15 PM 03/13/2020   10:35 PM  CMP  Glucose 70 - 99 mg/dL 147  96   BUN 6 - 20 mg/dL 21  16   Creatinine 8.29 - 1.24 mg/dL 5.62  1.30   Sodium 865 - 145 mmol/L 137  137   Potassium 3.5 - 5.1 mmol/L 3.1  3.3   Chloride 98 - 111 mmol/L 97  100   CO2 22 - 32 mmol/L 25  27   Calcium 8.9 - 10.3 mg/dL 9.0  9.8   Total Protein 6.5 - 8.1 g/dL 8.1    Total Bilirubin 0.3 - 1.2 mg/dL 1.4    Alkaline Phos 38 - 126 U/L 95    AST 15 - 41 U/L 27    ALT 0 - 44 U/L 22       Imaging studies:   CT Abdomen/Pelvis (02/22/2023) personally reviewed showing inflammation around the appendix consistent with appendicitis, no gross abscess, no free air, and radiologist report reviewed below:    IMPRESSION: Acute appendicitis without complicating feature. There are surrounding phlegmonous and inflammatory changes in the right lower quadrant without free air or organized fluid collection.    Assessment/Plan: (ICD-10's: K35.30) 57 y.o. male with acute appendicitis.    - Admit to general surgery  - Plan  for robotic assisted laparoscopic appendectomy with Dr Claudine Mouton, pending OR/Anesthesia availability  - All risks, benefits, and alternatives to above procedure(s) were discussed with the patient and his family at bedside, all of his questions were answered to his expressed satisfaction, patient expresses he wishes to proceed, and informed consent was obtained.   - NPO + IVF Support - IV Abx (Rocephin + Flagyl) - Monitor abdominal examination - Monitor leukocytosis - Monitor venous lactate - Pain control prn; antiemetics prn   - Mobilize    All of the above findings and recommendations were discussed with the patient and his family (wife, son) at bedside, and all of their questions were answered to their expressed satisfaction.  -- Lynden Oxford, PA-C [facility] 02/22/2023, 3:10 PM M-F: 7am - 4pm

## 2023-02-23 ENCOUNTER — Encounter: Payer: Self-pay | Admitting: Surgery

## 2023-02-23 DIAGNOSIS — E872 Acidosis, unspecified: Secondary | ICD-10-CM | POA: Diagnosis present

## 2023-02-23 DIAGNOSIS — R10813 Right lower quadrant abdominal tenderness: Secondary | ICD-10-CM | POA: Diagnosis present

## 2023-02-23 DIAGNOSIS — K6819 Other retroperitoneal abscess: Secondary | ICD-10-CM | POA: Diagnosis present

## 2023-02-23 DIAGNOSIS — E876 Hypokalemia: Secondary | ICD-10-CM | POA: Diagnosis present

## 2023-02-23 DIAGNOSIS — K3533 Acute appendicitis with perforation and localized peritonitis, with abscess: Secondary | ICD-10-CM | POA: Diagnosis present

## 2023-02-23 DIAGNOSIS — K219 Gastro-esophageal reflux disease without esophagitis: Secondary | ICD-10-CM | POA: Diagnosis present

## 2023-02-23 DIAGNOSIS — E785 Hyperlipidemia, unspecified: Secondary | ICD-10-CM | POA: Diagnosis present

## 2023-02-23 DIAGNOSIS — K5900 Constipation, unspecified: Secondary | ICD-10-CM | POA: Diagnosis present

## 2023-02-23 DIAGNOSIS — I959 Hypotension, unspecified: Secondary | ICD-10-CM | POA: Diagnosis present

## 2023-02-23 LAB — BASIC METABOLIC PANEL
Anion gap: 9 (ref 5–15)
BUN: 21 mg/dL — ABNORMAL HIGH (ref 6–20)
CO2: 24 mmol/L (ref 22–32)
Calcium: 8.3 mg/dL — ABNORMAL LOW (ref 8.9–10.3)
Chloride: 103 mmol/L (ref 98–111)
Creatinine, Ser: 1.17 mg/dL (ref 0.61–1.24)
GFR, Estimated: >60 mL/min (ref >60)
Glucose, Bld: 157 mg/dL — ABNORMAL HIGH (ref 70–99)
Potassium: 4.3 mmol/L (ref 3.5–5.1)
Sodium: 136 mmol/L (ref 135–145)

## 2023-02-23 LAB — CBC
HCT: 39.1 % (ref 39.0–52.0)
Hemoglobin: 13.5 g/dL (ref 13.0–17.0)
MCH: 30.5 pg (ref 26.0–34.0)
MCHC: 34.5 g/dL (ref 30.0–36.0)
MCV: 88.3 fL (ref 80.0–100.0)
Platelets: 293 10*3/uL (ref 150–400)
RBC: 4.43 MIL/uL (ref 4.22–5.81)
RDW: 11.5 % (ref 11.5–15.5)
WBC: 13.2 10*3/uL — ABNORMAL HIGH (ref 4.0–10.5)
nRBC: 0 % (ref 0.0–0.2)

## 2023-02-23 LAB — TROPONIN I (HIGH SENSITIVITY): Troponin I (High Sensitivity): 5 ng/L (ref <18)

## 2023-02-23 MED ORDER — SIMETHICONE 80 MG PO CHEW
80.0000 mg | CHEWABLE_TABLET | ORAL | Status: DC | PRN
  Administered 2023-02-23: 80 mg via ORAL
  Filled 2023-02-23: qty 1

## 2023-02-23 MED ORDER — OXYCODONE-ACETAMINOPHEN 5-325 MG PO TABS
1.0000 | ORAL_TABLET | ORAL | Status: DC | PRN
  Administered 2023-02-23: 1 via ORAL
  Filled 2023-02-23: qty 1

## 2023-02-23 MED ORDER — KETOROLAC TROMETHAMINE 30 MG/ML IJ SOLN
30.0000 mg | Freq: Four times a day (QID) | INTRAMUSCULAR | Status: DC
  Administered 2023-02-23 – 2023-02-25 (×10): 30 mg via INTRAVENOUS
  Filled 2023-02-23 (×10): qty 1

## 2023-02-23 MED ORDER — ALUM & MAG HYDROXIDE-SIMETH 200-200-20 MG/5ML PO SUSP
30.0000 mL | ORAL | Status: DC | PRN
  Administered 2023-02-23: 30 mL via ORAL
  Filled 2023-02-23: qty 30

## 2023-02-23 MED ORDER — ONDANSETRON HCL 4 MG/2ML IJ SOLN
4.0000 mg | Freq: Four times a day (QID) | INTRAMUSCULAR | Status: DC | PRN
  Administered 2023-02-23: 4 mg via INTRAVENOUS
  Filled 2023-02-23: qty 2

## 2023-02-23 MED ORDER — DIPHENHYDRAMINE HCL 50 MG/ML IJ SOLN: 25.0000 mg | Freq: Four times a day (QID) | INTRAMUSCULAR | Status: DC | PRN

## 2023-02-23 MED ORDER — SIMETHICONE 80 MG PO CHEW
80.0000 mg | CHEWABLE_TABLET | Freq: Four times a day (QID) | ORAL | Status: DC | PRN
  Administered 2023-02-23: 80 mg via ORAL
  Filled 2023-02-23: qty 1

## 2023-02-23 MED ORDER — PANTOPRAZOLE SODIUM 40 MG IV SOLR
40.0000 mg | Freq: Every day | INTRAVENOUS | Status: DC
  Administered 2023-02-23 – 2023-02-24 (×3): 40 mg via INTRAVENOUS
  Filled 2023-02-23 (×3): qty 10

## 2023-02-23 MED ORDER — ONDANSETRON 4 MG PO TBDP
4.0000 mg | ORAL_TABLET | Freq: Four times a day (QID) | ORAL | Status: DC | PRN
  Administered 2023-02-23: 4 mg via ORAL
  Filled 2023-02-23: qty 1

## 2023-02-23 MED ORDER — SODIUM CHLORIDE 0.9 % IV SOLN
INTRAVENOUS | Status: DC
Start: 2023-02-23 — End: 2023-02-23

## 2023-02-23 MED ORDER — PIPERACILLIN-TAZOBACTAM 3.375 G IVPB
3.3750 g | Freq: Three times a day (TID) | INTRAVENOUS | Status: DC
  Administered 2023-02-23 – 2023-02-25 (×8): 3.375 g via INTRAVENOUS
  Filled 2023-02-23 (×8): qty 50

## 2023-02-23 MED ORDER — DIPHENHYDRAMINE HCL 25 MG PO CAPS: 25.0000 mg | ORAL_CAPSULE | Freq: Four times a day (QID) | ORAL | Status: DC | PRN

## 2023-02-23 MED ORDER — HEPARIN SODIUM (PORCINE) 5000 UNIT/ML IJ SOLN
5000.0000 [IU] | Freq: Three times a day (TID) | INTRAMUSCULAR | Status: DC
  Administered 2023-02-23 – 2023-02-25 (×7): 5000 [IU] via SUBCUTANEOUS
  Filled 2023-02-23 (×7): qty 1

## 2023-02-23 MED ORDER — ZOLPIDEM TARTRATE 5 MG PO TABS
5.0000 mg | ORAL_TABLET | Freq: Every evening | ORAL | Status: DC | PRN
  Administered 2023-02-23 – 2023-02-25 (×2): 5 mg via ORAL
  Filled 2023-02-23 (×2): qty 1

## 2023-02-23 MED ORDER — POLYETHYLENE GLYCOL 3350 17 G PO PACK
17.0000 g | PACK | Freq: Two times a day (BID) | ORAL | Status: DC
  Administered 2023-02-23 – 2023-02-24 (×4): 17 g via ORAL
  Filled 2023-02-23 (×6): qty 1

## 2023-02-23 MED ORDER — HYDROMORPHONE HCL 1 MG/ML IJ SOLN
1.0000 mg | INTRAMUSCULAR | Status: DC | PRN
  Administered 2023-02-23: 1 mg via INTRAVENOUS
  Filled 2023-02-23: qty 1

## 2023-02-23 NOTE — Progress Notes (Signed)
[facility] SURGICAL PROGRESS NOTE  Hospital Day(s): 0.   Post op day(s): 1 Day Post-Op.   Interval History:  Patient seen and examined Overnight with a lot of burping and reflux; improving this AM Patient reports he is sore but feeling okay Some distension this AM No nausea, no emesis  Leukocytosis is improving; now 13.2K Hgb to 13.5 Renal function normal; sCr - 1.17; UO - 300 ccs + unmeasured Drain with 150 ccs out; serous  Vital signs in last 24 hours: [min-max] current  Temp:  [97.3 F (36.3 C)-98.8 F (37.1 C)] 97.9 F (36.6 C) (06/20 0756) Pulse Rate:  [57-84] 65 (06/20 0756) Resp:  [12-19] 18 (06/20 0756) BP: (87-136)/(57-83) 105/67 (06/20 0756) SpO2:  [92 %-100 %] 92 % (06/20 0756) Weight:  [90.7 kg] 90.7 kg (06/19 1317)     Height: 5\' 10"  (177.8 cm) Weight: 90.7 kg BMI (Calculated): 28.7   Intake/Output last 2 shifts:  06/19 0701 - 06/20 0700 In: 2040.3 [I.V.:802.1; IV Piggyback:1238.1] Out: 475 [Urine:300; Drains:150; Blood:25]   Physical Exam:  Constitutional: alert, cooperative and no distress  Respiratory: breathing non-labored at rest  Cardiovascular: regular rate and sinus rhythm  Gastrointestinal: soft, incisional soreness, and non-distended, no rebound/guarding. Surgical drain in suprapubic incision, serous  Integumentary: Laparoscopic incisions are CDI with dermabond, no erythema or drainage   Labs:     Latest Ref Rng & Units 02/23/2023    2:34 AM 02/22/2023    1:15 PM 03/13/2020   10:35 PM  CBC  WBC 4.0 - 10.5 K/uL 13.2  14.6  7.4   Hemoglobin 13.0 - 17.0 g/dL 16.1  09.6  04.5   Hematocrit 39.0 - 52.0 % 39.1  45.2  43.1   Platelets 150 - 400 K/uL 293  416  272       Latest Ref Rng & Units 02/23/2023    2:34 AM 02/22/2023    1:15 PM 03/13/2020   10:35 PM  CMP  Glucose 70 - 99 mg/dL 409  811  96   BUN 6 - 20 mg/dL 21  21  16    Creatinine 0.61 - 1.24 mg/dL 9.14  7.82  9.56   Sodium 135 - 145 mmol/L 136  137  137   Potassium 3.5 -  5.1 mmol/L 4.3  3.1  3.3   Chloride 98 - 111 mmol/L 103  97  100   CO2 22 - 32 mmol/L 24  25  27    Calcium 8.9 - 10.3 mg/dL 8.3  9.0  9.8   Total Protein 6.5 - 8.1 g/dL  8.1    Total Bilirubin 0.3 - 1.2 mg/dL  1.4    Alkaline Phos 38 - 126 U/L  95    AST 15 - 41 U/L  27    ALT 0 - 44 U/L  22       Imaging studies: No new pertinent imaging studies   Assessment/Plan:  57 y.o. male 1 Day Post-Op s/p robotic assisted laparoscopic appendectomy for acute appendicitis with abscess    - Will leave CLD for now; advance diet as tolerated - Continue IV Abx - Monitor abdominal examination; on-going bowel function - Add PPI - Pain control prn; antiemetics prn   - Monitor leukocytosis; improving   - Needs to ambulate    - Discharge Planning: Given appendicitis with abscess, will give another 24 hours for IV Abx and optimization. Anticipate DC tomorrow (06/21)   All of the above findings and recommendations were discussed with the  patient, and the medical team, and all of patient's questions were answered to his expressed satisfaction.  -- Lynden Oxford, PA-C [facility] 02/23/2023, 8:18 AM M-F: 7am - 4pm

## 2023-02-23 NOTE — Anesthesia Postprocedure Evaluation (Signed)
Anesthesia Post Note  Patient: John Medina  Procedure(s) Performed: XI ROBOTIC LAPAROSCOPIC ASSISTED APPENDECTOMY (Abdomen)  Patient location during evaluation: PACU Anesthesia Type: General Level of consciousness: awake and alert Pain management: pain level controlled Vital Signs Assessment: post-procedure vital signs reviewed and stable Respiratory status: spontaneous breathing, nonlabored ventilation, respiratory function stable and patient connected to nasal cannula oxygen Cardiovascular status: blood pressure returned to baseline and stable Postop Assessment: no apparent nausea or vomiting Anesthetic complications: no   No notable events documented.   Last Vitals:  Vitals:   02/23/23 0106 02/23/23 0109  BP: 108/66 108/66  Pulse: 60 (!) 58  Resp: 16   Temp: 36.5 C   SpO2: 95% 92%    Last Pain:  Vitals:   02/23/23 0106  TempSrc: Oral  PainSc:                  Cleda Mccreedy Niasia Lanphear

## 2023-02-24 LAB — CULTURE, BLOOD (ROUTINE X 2): Culture: NO GROWTH

## 2023-02-24 LAB — CBC
HCT: 33.2 % — ABNORMAL LOW (ref 39.0–52.0)
Hemoglobin: 11.6 g/dL — ABNORMAL LOW (ref 13.0–17.0)
MCH: 31.4 pg (ref 26.0–34.0)
MCHC: 34.9 g/dL (ref 30.0–36.0)
MCV: 90 fL (ref 80.0–100.0)
Platelets: 275 10*3/uL (ref 150–400)
RBC: 3.69 MIL/uL — ABNORMAL LOW (ref 4.22–5.81)
RDW: 11.8 % (ref 11.5–15.5)
WBC: 18.1 10*3/uL — ABNORMAL HIGH (ref 4.0–10.5)
nRBC: 0 % (ref 0.0–0.2)

## 2023-02-24 LAB — SURGICAL PATHOLOGY

## 2023-02-24 LAB — AEROBIC/ANAEROBIC CULTURE W GRAM STAIN (SURGICAL/DEEP WOUND)

## 2023-02-24 MED ORDER — MAGNESIUM HYDROXIDE 400 MG/5ML PO SUSP
5.0000 mL | Freq: Every day | ORAL | Status: DC | PRN
  Administered 2023-02-24: 5 mL via ORAL
  Filled 2023-02-24: qty 30

## 2023-02-24 MED ORDER — AMOXICILLIN-POT CLAVULANATE 875-125 MG PO TABS: 1.0000 | ORAL_TABLET | Freq: Two times a day (BID) | ORAL | 0 refills | Status: AC

## 2023-02-24 MED ORDER — OXYCODONE-ACETAMINOPHEN 5-325 MG PO TABS: 1.0000 | ORAL_TABLET | Freq: Four times a day (QID) | ORAL | 0 refills | Status: AC | PRN

## 2023-02-24 MED ORDER — ONDANSETRON 4 MG PO TBDP: 4.0000 mg | ORAL_TABLET | Freq: Four times a day (QID) | ORAL | 0 refills | Status: AC | PRN

## 2023-02-24 NOTE — TOC CM/SW Note (Signed)
Transition of Care [facility]) - Inpatient Brief Assessment   Patient Details  Name: John Medina MRN: [id number] Date of Birth: Oct 15, 1965  Transition of Care [facility]) CM/SW Contact:    Margarito Liner, LCSW Phone Number: 02/24/2023, 1:09 PM   Clinical Narrative: CSW reviewed chart. No TOC needs identified at this time. CSW will continue to follow progress. Please place [facility] consult if any needs arise.  Transition of Care Asessment: Insurance and Status: Insurance coverage has been reviewed Patient has primary care physician: Yes Home environment has been reviewed: Single family home Prior level of function:: Not documented Prior/Current Home Services: No current home services Social Determinants of Health Reivew: SDOH reviewed no interventions necessary Readmission risk has been reviewed: Yes Transition of care needs: no transition of care needs at this time

## 2023-02-24 NOTE — Progress Notes (Signed)
[facility] SURGICAL PROGRESS NOTE  Hospital Day(s): 1.   Post op day(s): 2 Days Post-Op.   Interval History:  Patient seen and examined No acute events or new complaints overnight.  Patient reports he is feeling much better this morning Abdominal discomfort improved; mild distension still No fever, chills, nausea, emesis He is passing flatus Leukocytosis did worsen this morning; 18.1K OR Cx with WBC; no organisms Drain with 130 ccs out; serous He is advanced to regular diet   Vital signs in last 24 hours: [min-max] current  Temp:  [97.7 F (36.5 C)-98.2 F (36.8 C)] 98 F (36.7 C) (06/21 0738) Pulse Rate:  [58-72] 59 (06/21 0738) Resp:  [16-18] 16 (06/21 0738) BP: (106-126)/(63-79) 106/69 (06/21 0738) SpO2:  [94 %-97 %] 94 % (06/21 0738)     Height: 5\' 10"  (177.8 cm) Weight: 90.7 kg BMI (Calculated): 28.7   Intake/Output last 2 shifts:  06/20 0701 - 06/21 0700 In: 600 [P.O.:600] Out: 130 [Drains:130]   Physical Exam:  Constitutional: alert, cooperative and no distress  Respiratory: breathing non-labored at rest  Cardiovascular: regular rate and sinus rhythm  Gastrointestinal: soft, non-tender, mild distension, no rebound/guarding. Drain in suprapubic region; serous Integumentary: Laparoscopic incisions are CDI with dermabond, no erythema or drainage   Labs:     Latest Ref Rng & Units 02/24/2023    4:43 AM 02/23/2023    2:34 AM 02/22/2023    1:15 PM  CBC  WBC 4.0 - 10.5 K/uL 18.1  13.2  14.6   Hemoglobin 13.0 - 17.0 g/dL 86.5  78.4  69.6   Hematocrit 39.0 - 52.0 % 33.2  39.1  45.2   Platelets 150 - 400 K/uL 275  293  416       Latest Ref Rng & Units 02/23/2023    2:34 AM 02/22/2023    1:15 PM 03/13/2020   10:35 PM  CMP  Glucose 70 - 99 mg/dL 295  284  96   BUN 6 - 20 mg/dL 21  21  16    Creatinine 0.61 - 1.24 mg/dL 1.32  4.40  1.02   Sodium 135 - 145 mmol/L 136  137  137   Potassium 3.5 - 5.1 mmol/L 4.3  3.1  3.3   Chloride 98 - 111 mmol/L 103   97  100   CO2 22 - 32 mmol/L 24  25  27    Calcium 8.9 - 10.3 mg/dL 8.3  9.0  9.8   Total Protein 6.5 - 8.1 g/dL  8.1    Total Bilirubin 0.3 - 1.2 mg/dL  1.4    Alkaline Phos 38 - 126 U/L  95    AST 15 - 41 U/L  27    ALT 0 - 44 U/L  22      Imaging studies: No new pertinent imaging studies   Assessment/Plan:  57 y.o. male 2 Days Post-Op s/p robotic assisted laparoscopic appendectomy for appendicitis with abscess    - Okay to continue regular diet  - Okay for bowel regimen  - Continue IV Abx (Zosyn); will send PO Rx today for home - Monitor abdominal examination; on-going bowel function - Continue drain; monitor and record output - will go home with this - Pain control prn; antiemetics prn              - Monitor leukocytosis; worsening             - Ambulate; doing well    - Discharge Planning; Clinically doing  well although had worsening leukocytosis, suspicion for complication very low but after discussion agreed to keep another 24 hours to trend. If improved, will plan for DC tomorrow (06/22). If worsened, may need repeat CT to ensure no missed intra-abdominal etiology. I will send all his Rx today along with arrange follow up with myself on 06/25 for drain removal.   All of the above findings and recommendations were discussed with the patient, and the medical team, and all of patient's questions were answered to his expressed satisfaction.  -- Lynden Oxford, PA-C [facility] 02/24/2023, 8:25 AM M-F: 7am - 4pm

## 2023-02-25 LAB — CBC
HCT: 35 % — ABNORMAL LOW (ref 39.0–52.0)
Hemoglobin: 11.9 g/dL — ABNORMAL LOW (ref 13.0–17.0)
MCH: 30.9 pg (ref 26.0–34.0)
MCHC: 34 g/dL (ref 30.0–36.0)
MCV: 90.9 fL (ref 80.0–100.0)
Platelets: 322 10*3/uL (ref 150–400)
RBC: 3.85 MIL/uL — ABNORMAL LOW (ref 4.22–5.81)
RDW: 12 % (ref 11.5–15.5)
WBC: 13.2 10*3/uL — ABNORMAL HIGH (ref 4.0–10.5)
nRBC: 0 % (ref 0.0–0.2)

## 2023-02-25 LAB — AEROBIC/ANAEROBIC CULTURE W GRAM STAIN (SURGICAL/DEEP WOUND)

## 2023-02-25 MED ORDER — IBUPROFEN 800 MG PO TABS: 800.0000 mg | ORAL_TABLET | Freq: Three times a day (TID) | ORAL | 1 refills | Status: AC | PRN

## 2023-02-25 MED ORDER — ACETAMINOPHEN 500 MG PO TABS: 1000.0000 mg | ORAL_TABLET | Freq: Four times a day (QID) | ORAL | Status: AC | PRN

## 2023-02-25 NOTE — Progress Notes (Signed)
Patient provided with discharge materials and education. Iv removed with no complications. All questions and concerns addressed at this time. Patient escorted off with member of unit staff and all of his belongings

## 2023-02-25 NOTE — Discharge Summary (Signed)
Patient ID: John Medina MRN: [id number] DOB/AGE: 05-14-1966 57 y.o.  Admit date: 02/22/2023 Discharge date: 02/25/2023   Discharge Diagnoses:  Principal Problem:   Acute appendicitis with peritoneal abscess Active Problems:   Acute appendicitis with appendiceal abscess   Procedures:Robotic assisted appendectomy  Hospital Course: Patient was admitted on 02/22/23 with acute appendicitis and was taken to the OR on same day for robotic assisted appendectomy.  Drain was left in place.  Post-operatively the patient has recovered well.  He's tolerating a regular diet, having bowel function, pain is well controlled, ambulating.  Drain with serosanguinous fluid.  Abdomen is soft, non-distended, appropriately tender.  Incisions are clean, dry, intact.  WBC much improved today.  Will d/c home today with follow up next week for drain removal.  Consults: None  Disposition: Discharge disposition: 01-Home or Self Care       Discharge Instructions     Call MD for:  difficulty breathing, headache or visual disturbances   Complete by: As directed    Call MD for:  persistant nausea and vomiting   Complete by: As directed    Call MD for:  redness, tenderness, or signs of infection (pain, swelling, redness, odor or green/yellow discharge around incision site)   Complete by: As directed    Call MD for:  severe uncontrolled pain   Complete by: As directed    Call MD for:  temperature >100.4   Complete by: As directed    Diet general   Complete by: As directed    Regular home diet   Discharge instructions   Complete by: As directed    1.  Patient may shower, but do not scrub wounds heavily and dab dry only. 2.  Do not submerge wounds in pool/tub until fully healed. 3.  Do not apply ointments or hydrogen peroxide to the wounds. 4.  May apply ice packs to the wounds for comfort. 5.  Please empty and record drain output at least daily and as needed to keep the bulb only half full. 6.  Change  drain dressing with dry gauze around drain entry site once daily.  Secure with tape.   Driving Restrictions   Complete by: As directed    Do not drive while taking narcotics for pain control.  Prior to driving, make sure you are able to rotate right and left to look at blindspots without significant pain or discomfort.   Increase activity slowly   Complete by: As directed    Lifting restrictions   Complete by: As directed    No heavy lifting or pushing of more than 10-15 lbs for 4 weeks.      Allergies as of 02/25/2023   No Known Allergies      Medication List     TAKE these medications    acetaminophen 500 MG tablet Commonly known as: TYLENOL Take 2 tablets (1,000 mg total) by mouth every 6 (six) hours as needed for mild pain.   amoxicillin-clavulanate 875-125 MG tablet Commonly known as: AUGMENTIN Take 1 tablet by mouth 2 (two) times daily for 5 days.   ibuprofen 800 MG tablet Commonly known as: ADVIL Take 1 tablet (800 mg total) by mouth every 8 (eight) hours as needed for moderate pain. What changed:  medication strength how much to take reasons to take this   ondansetron 4 MG disintegrating tablet Commonly known as: ZOFRAN-ODT Take 1 tablet (4 mg total) by mouth every 6 (six) hours as needed for nausea.   oxyCODONE-acetaminophen 5-325  MG tablet Commonly known as: PERCOCET/ROXICET Take 1 tablet by mouth every 6 (six) hours as needed for moderate pain or severe pain.   rosuvastatin 5 MG tablet Commonly known as: CRESTOR Take 5 mg by mouth 2 (two) times a week.        Follow-up Information     Donovan Kail, PA-C. Go on 02/28/2023.   Specialty: Physician Assistant Why: Go to appointment on 06/25 at 1:30 PM Contact information: [address] [place] [address] [phone]

## 2023-02-27 LAB — CULTURE, BLOOD (ROUTINE X 2)
Culture: NO GROWTH
Special Requests: ADEQUATE

## 2023-02-28 ENCOUNTER — Encounter: Payer: Self-pay | Admitting: Physician Assistant

## 2023-02-28 ENCOUNTER — Ambulatory Visit (INDEPENDENT_AMBULATORY_CARE_PROVIDER_SITE_OTHER): Payer: No Typology Code available for payment source | Admitting: Physician Assistant

## 2023-02-28 VITALS — BP 118/71 | HR 76 | Temp 98.2°F | Wt 201.0 lb

## 2023-02-28 DIAGNOSIS — K3533 Acute appendicitis with perforation and localized peritonitis, with abscess: Secondary | ICD-10-CM

## 2023-02-28 DIAGNOSIS — Z09 Encounter for follow-up examination after completed treatment for conditions other than malignant neoplasm: Secondary | ICD-10-CM

## 2023-02-28 LAB — AEROBIC/ANAEROBIC CULTURE W GRAM STAIN (SURGICAL/DEEP WOUND)

## 2023-02-28 MED ORDER — OMEPRAZOLE 20 MG PO CPDR: 20.0000 mg | DELAYED_RELEASE_CAPSULE | Freq: Two times a day (BID) | ORAL | 0 refills | Status: AC

## 2023-02-28 NOTE — Patient Instructions (Addendum)
Please pick up your prescription at your pharmacy.  Finish the antibiotics as directed.  If you have any concerns or questions, please feel free to call our office. See follow up appointment.   Laparoscopic Appendectomy, Adult, Care After What can I expect after the procedure? After a laparoscopic appendectomy, it is common to have: Little energy for normal activities. Mild pain in the area where the cuts from surgery (incisions) were made. Trouble pooping (constipation). This may be caused by: Pain medicine. A lack of activity. Gas pain that can spread to your shoulders. Follow these instructions at home: Your doctor may give you more instructions. If you have problems, contact your doctor. Medicines Take over-the-counter and prescription medicines only as told by your doctor. Do not stop taking antibiotics even if you start to feel better. Take pain medicines before your pain gets very bad. If told, take steps to prevent problems with pooping (constipation). You may need to: Drink enough fluid to keep your pee (urine) pale yellow. Take medicines. You will be told what medicines to take. Eat foods that are high in fiber. These include beans, whole grains, and fresh fruits and vegetables. Limit foods that are high in fat and sugar. These include fried or sweet foods. Ask your doctor if you should avoid driving or using machines while you are taking your medicine. Incision care  Follow instructions from your doctor about how to take care of your incisions. Make sure you: Wash your hands with soap and water for at least 20 seconds before and after you change your bandage. If you cannot use soap and water, use hand sanitizer. Change your bandage. Leave stitches, staples, or skin glue in place for at least 2 weeks. Leave tape strips alone unless you are told to take them off. You may trim the edges of the tape strips if they curl up. Check your incisions every day for signs of  infection. Check for: Redness, swelling, or pain. Fluid or blood. Warmth. Pus or a bad smell. Bathing Do not take baths, swim, or use a hot tub. Ask your doctor about taking showers or sponge baths. Keep your incisions clean and dry. Clean them as told by your doctor. To do this: Gently wash the incisions with soap and water. Rinse the incisions with water to get all the soap off. Pat the incisions dry with a clean towel. Do not rub the incisions. Activity  If you were given a sedative during your procedure, do not drive or use machines until your doctor says that it is safe. A sedative is a medicine that helps you relax. Rest as told by your doctor. Do not lift anything that is heavier than 10 lb (4.5 kg). Do not play contact sports until your doctor says that it is safe. Return to your normal activities when your doctor says that it is safe. General instructions If you were sent home with a drain, follow instructions from your doctor on how to care for it. Take deep breaths. This helps to keep your lungs from getting an infection (pneumonia). If you need to cough or sneeze, place a pillow or blanket on your belly before you do so. This will help you control the pain. Keep all follow-up visits. Contact a doctor if: You have any of these signs of infection in an incision: Redness, swelling, or pain. Fluid or blood. Warmth. Pus or a bad smell. Your incisions open after the stitches have been taken out. You lose your appetite. You feel  as if you may vomit. You vomit. You get watery poop (diarrhea), or you cannot control your poop. You get a rash. Get help right away if: You have a fever. You have trouble breathing. You have sharp pains in your chest. You have swelling or pain in your legs. You faint. These symptoms may be an emergency. Get help right away. Call 911. Do not wait to see if the symptoms will go away. Do not drive yourself to the hospital. Summary After the  procedure, it is common to have low energy, mild pain, trouble pooping, and gas pain. Follow your doctor's instructions about caring for yourself after the procedure. Rest after the procedure. Return to your normal activities as told by your doctor. Contact your doctor if you see signs of infection around your incisions. Get help right away if you have a fever, chest pain, or trouble breathing. This information is not intended to replace advice given to you by your health care provider. Make sure you discuss any questions you have with your health care provider. Document Revised: 06/03/2021 Document Reviewed: 06/03/2021 Elsevier Patient Education  2024 [employer].

## 2023-03-01 NOTE — Progress Notes (Signed)
[facility] POST-OP OFFICE VISIT  03/01/2023  HPI: John Medina is a 57 y.o. male 6 days s/p robotic assisted laparoscopic appendectomy for appendicitis with abscess with Dr Claudine Mouton   He is overall doing okay Did have a rough day yesterday with nausea and emesis; since resolved Abdominal soreness as expected No fever, chills He is passing flatus; having bowel movements Drain is serous; slight increase in last 48 hours but otherwise about 40 ccs daily or less Tolerating PO Ambulating   Vital signs: BP 118/71   Pulse 76   Temp 98.2 F (36.8 C)   Wt 201 lb (91.2 kg)   SpO2 98%   BMI 28.84 kg/m    Physical Exam: Constitutional: Well appearing male, NAD Abdomen: Soft, incisional soreness, no overt distension, no rebound/guarding. Surgical drain in suprapubic incision; serous (REMOVED) Skin: Laparoscopic incisions are healing well, no erythema or drainage   Assessment/Plan: This is a 57 y.o. male 6 days s/p robotic assisted laparoscopic appendectomy for appendicitis with abscess with Dr Claudine Mouton    - Removed drain without incident; dressing placed; reviewed care  - Question if he continues to have some component of adynamic ileus to explain nausea/emesis. Otherwise tolerating PO, maintaining hydration, and having bowel function. Low suspicion for any other intra-abdominal complications (ie: abscess, bowel injury). Continue soft diet, chewing gum, hard candies, monitor bowel function. Reviewed signs and symptoms to warrant call to the office or presentation to ED  - He is having increase reflux since surgery; will give PPI BID for 30 days  - Complete Abx; no issues  - Pain control prn  - Reviewed wound care recommendation  - Reviewed lifting restrictions; 4 weeks total  - Reviewed surgical pathology; Appendicitis   - I will see him next week for recheck; He understands to call with questions/concerns in the interim   -- Lynden Oxford, PA-C [facility] 03/01/2023, 9:35 AM M-F: 7am - 4pm

## 2023-03-07 ENCOUNTER — Encounter: Payer: No Typology Code available for payment source | Admitting: Physician Assistant

## 2023-07-27 ENCOUNTER — Other Ambulatory Visit: Payer: Self-pay | Admitting: Physical Medicine and Rehabilitation

## 2023-07-27 DIAGNOSIS — M4802 Spinal stenosis, cervical region: Secondary | ICD-10-CM

## 2023-08-16 ENCOUNTER — Ambulatory Visit
Admission: RE | Admit: 2023-08-16 | Discharge: 2023-08-16 | Disposition: A | Discharge status: Home / Self Care | Payer: No Typology Code available for payment source | Source: Ambulatory Visit | Attending: Physical Medicine and Rehabilitation | Admitting: Physical Medicine and Rehabilitation

## 2023-08-16 DIAGNOSIS — M4802 Spinal stenosis, cervical region: Secondary | ICD-10-CM
